# Patient Record
Sex: Male | Born: 1950 | Race: White | Hispanic: No | Smoking: Current every day smoker
Health system: Southern US, Community
[De-identification: ages and names within clinical notes are randomized; demographics above are authoritative.]

## PROBLEM LIST (undated history)

## (undated) DIAGNOSIS — J449 Chronic obstructive pulmonary disease, unspecified: Secondary | ICD-10-CM

---

## 2017-04-19 ENCOUNTER — Emergency Department (HOSPITAL_COMMUNITY): Payer: No Typology Code available for payment source

## 2017-04-19 ENCOUNTER — Encounter (HOSPITAL_COMMUNITY): Payer: Self-pay | Admitting: Emergency Medicine

## 2017-04-19 ENCOUNTER — Emergency Department (HOSPITAL_COMMUNITY)
Admission: EM | Admit: 2017-04-19 | Discharge: 2017-04-20 | Disposition: A | Discharge status: Home / Self Care | Payer: No Typology Code available for payment source | Attending: Emergency Medicine | Admitting: Emergency Medicine

## 2017-04-19 DIAGNOSIS — Y999 Unspecified external cause status: Secondary | ICD-10-CM | POA: Insufficient documentation

## 2017-04-19 DIAGNOSIS — F17200 Nicotine dependence, unspecified, uncomplicated: Secondary | ICD-10-CM | POA: Diagnosis not present

## 2017-04-19 DIAGNOSIS — S2242XA Multiple fractures of ribs, left side, initial encounter for closed fracture: Secondary | ICD-10-CM | POA: Insufficient documentation

## 2017-04-19 DIAGNOSIS — S42145A Nondisplaced fracture of glenoid cavity of scapula, left shoulder, initial encounter for closed fracture: Secondary | ICD-10-CM | POA: Insufficient documentation

## 2017-04-19 DIAGNOSIS — Y9389 Activity, other specified: Secondary | ICD-10-CM | POA: Insufficient documentation

## 2017-04-19 DIAGNOSIS — R042 Hemoptysis: Secondary | ICD-10-CM | POA: Insufficient documentation

## 2017-04-19 DIAGNOSIS — Y92410 Unspecified street and highway as the place of occurrence of the external cause: Secondary | ICD-10-CM | POA: Diagnosis not present

## 2017-04-19 DIAGNOSIS — J449 Chronic obstructive pulmonary disease, unspecified: Secondary | ICD-10-CM | POA: Diagnosis not present

## 2017-04-19 DIAGNOSIS — M542 Cervicalgia: Secondary | ICD-10-CM | POA: Insufficient documentation

## 2017-04-19 DIAGNOSIS — S022XXA Fracture of nasal bones, initial encounter for closed fracture: Secondary | ICD-10-CM | POA: Diagnosis not present

## 2017-04-19 DIAGNOSIS — R0602 Shortness of breath: Secondary | ICD-10-CM | POA: Diagnosis not present

## 2017-04-19 DIAGNOSIS — M791 Myalgia: Secondary | ICD-10-CM | POA: Diagnosis not present

## 2017-04-19 DIAGNOSIS — S299XXA Unspecified injury of thorax, initial encounter: Secondary | ICD-10-CM | POA: Diagnosis present

## 2017-04-19 HISTORY — DX: Chronic obstructive pulmonary disease, unspecified: J44.9
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
## 2017-04-19 NOTE — ED Notes (Signed)
Pt returned from xray/CT.

## 2017-04-19 NOTE — ED Notes (Signed)
Patient transported to CT/xray 

## 2017-04-20 ENCOUNTER — Emergency Department (HOSPITAL_COMMUNITY): Payer: No Typology Code available for payment source

## 2017-04-20 ENCOUNTER — Encounter (HOSPITAL_COMMUNITY): Payer: Self-pay

## 2017-04-20 DIAGNOSIS — S2242XA Multiple fractures of ribs, left side, initial encounter for closed fracture: Secondary | ICD-10-CM | POA: Diagnosis not present

## 2017-04-20 LAB — COMPREHENSIVE METABOLIC PANEL
ALT: 23 U/L (ref 17–63)
AST: 43 U/L — ABNORMAL HIGH (ref 15–41)
Albumin: 3.9 g/dL (ref 3.5–5.0)
Alkaline Phosphatase: 55 U/L (ref 38–126)
Anion gap: 8 (ref 5–15)
BUN: 10 mg/dL (ref 6–20)
CHLORIDE: 101 mmol/L (ref 101–111)
CO2: 25 mmol/L (ref 22–32)
CREATININE: 0.88 mg/dL (ref 0.61–1.24)
Calcium: 8.9 mg/dL (ref 8.9–10.3)
GFR calc Af Amer: >60 mL/min (ref >60)
GFR calc non Af Amer: >60 mL/min (ref >60)
Glucose, Bld: 114 mg/dL — ABNORMAL HIGH (ref 65–99)
POTASSIUM: 3.3 mmol/L — AB (ref 3.5–5.1)
Sodium: 134 mmol/L — ABNORMAL LOW (ref 135–145)
Total Bilirubin: 0.4 mg/dL (ref 0.3–1.2)
Total Protein: 6.4 g/dL — ABNORMAL LOW (ref 6.5–8.1)

## 2017-04-20 LAB — CBC
HEMATOCRIT: 42.4 % (ref 39.0–52.0)
HEMOGLOBIN: 14.6 g/dL (ref 13.0–17.0)
MCH: 33.2 pg (ref 26.0–34.0)
MCHC: 34.4 g/dL (ref 30.0–36.0)
MCV: 96.4 fL (ref 78.0–100.0)
Platelets: 231 10*3/uL (ref 150–400)
RBC: 4.4 MIL/uL (ref 4.22–5.81)
RDW: 14.8 % (ref 11.5–15.5)
WBC: 19.7 10*3/uL — ABNORMAL HIGH (ref 4.0–10.5)

## 2017-04-20 LAB — ETHANOL: ALCOHOL ETHYL (B): 45 mg/dL — AB (ref <5)

## 2017-04-20 MED ORDER — HYDROMORPHONE HCL 1 MG/ML IJ SOLN
0.5000 mg | Freq: Once | INTRAMUSCULAR | Status: AC
  Administered 2017-04-20: 0.5 mg via INTRAVENOUS
  Filled 2017-04-20: qty 1

## 2017-04-20 MED ORDER — OXYCODONE-ACETAMINOPHEN 5-325 MG PO TABS: 2.0000 | ORAL_TABLET | ORAL | 0 refills | Status: DC | PRN

## 2017-04-20 MED ORDER — ONDANSETRON HCL 4 MG/2ML IJ SOLN
4.0000 mg | Freq: Once | INTRAMUSCULAR | Status: AC
  Administered 2017-04-20: 4 mg via INTRAVENOUS
  Filled 2017-04-20: qty 2

## 2017-04-20 MED ORDER — IOPAMIDOL (ISOVUE-300) INJECTION 61%
100.0000 mL | Freq: Once | INTRAVENOUS | Status: AC | PRN
  Administered 2017-04-20: 100 mL via INTRAVENOUS

## 2017-04-20 MED ORDER — ONDANSETRON HCL 4 MG PO TABS: 4.0000 mg | ORAL_TABLET | Freq: Four times a day (QID) | ORAL | 0 refills | Status: AC

## 2017-04-20 MED ORDER — OXYCODONE-ACETAMINOPHEN 5-325 MG PO TABS: 1.0000 | ORAL_TABLET | ORAL | 0 refills | Status: DC | PRN

## 2017-04-20 MED FILL — Oxycodone w/ Acetaminophen Tab 5-325 MG: ORAL | Qty: 6 | Status: AC

## 2017-04-20 NOTE — ED Notes (Signed)
Patient transported to CT 
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
## 2017-04-20 NOTE — Discharge Instructions (Signed)
Return to the ER immediately if you have uncontrolled pain or if you are experiencing increased shortness of breath

## 2017-04-20 NOTE — ED Notes (Signed)
Pt's family came to nursing desk and asked if pt could have pain medication, Dr Blinda Leatherwood in room with pt and was notified of request when he came out of room,

## 2017-04-22 ENCOUNTER — Emergency Department (HOSPITAL_COMMUNITY)
Admission: EM | Admit: 2017-04-22 | Discharge: 2017-04-22 | Disposition: A | Discharge status: Home / Self Care | Payer: No Typology Code available for payment source | Attending: Emergency Medicine | Admitting: Emergency Medicine

## 2017-04-22 ENCOUNTER — Emergency Department (HOSPITAL_COMMUNITY): Payer: No Typology Code available for payment source

## 2017-04-22 ENCOUNTER — Encounter (HOSPITAL_COMMUNITY): Payer: Self-pay | Admitting: Emergency Medicine

## 2017-04-22 DIAGNOSIS — R05 Cough: Secondary | ICD-10-CM | POA: Insufficient documentation

## 2017-04-22 DIAGNOSIS — R0602 Shortness of breath: Secondary | ICD-10-CM | POA: Diagnosis not present

## 2017-04-22 DIAGNOSIS — F17200 Nicotine dependence, unspecified, uncomplicated: Secondary | ICD-10-CM | POA: Diagnosis not present

## 2017-04-22 DIAGNOSIS — S2242XD Multiple fractures of ribs, left side, subsequent encounter for fracture with routine healing: Secondary | ICD-10-CM

## 2017-04-22 DIAGNOSIS — J449 Chronic obstructive pulmonary disease, unspecified: Secondary | ICD-10-CM | POA: Diagnosis not present

## 2017-04-22 DIAGNOSIS — R0789 Other chest pain: Secondary | ICD-10-CM | POA: Diagnosis not present

## 2017-04-22 LAB — CBC WITH DIFFERENTIAL/PLATELET
BASOS ABS: 0.1 10*3/uL (ref 0.0–0.1)
Basophils Relative: 1 %
EOS PCT: 7 %
Eosinophils Absolute: 1.1 10*3/uL — ABNORMAL HIGH (ref 0.0–0.7)
HCT: 42.5 % (ref 39.0–52.0)
Hemoglobin: 14 g/dL (ref 13.0–17.0)
LYMPHS PCT: 9 %
Lymphs Abs: 1.4 10*3/uL (ref 0.7–4.0)
MCH: 32.6 pg (ref 26.0–34.0)
MCHC: 32.9 g/dL (ref 30.0–36.0)
MCV: 98.8 fL (ref 78.0–100.0)
MONO ABS: 1.4 10*3/uL — AB (ref 0.1–1.0)
MONOS PCT: 9 %
Neutro Abs: 12.1 10*3/uL — ABNORMAL HIGH (ref 1.7–7.7)
Neutrophils Relative %: 74 %
PLATELETS: 289 10*3/uL (ref 150–400)
RBC: 4.3 MIL/uL (ref 4.22–5.81)
RDW: 15.3 % (ref 11.5–15.5)
WBC: 16.1 10*3/uL — ABNORMAL HIGH (ref 4.0–10.5)

## 2017-04-22 LAB — COMPREHENSIVE METABOLIC PANEL
ALT: 20 U/L (ref 17–63)
ANION GAP: 8 (ref 5–15)
AST: 36 U/L (ref 15–41)
Albumin: 3.2 g/dL — ABNORMAL LOW (ref 3.5–5.0)
Alkaline Phosphatase: 58 U/L (ref 38–126)
BILIRUBIN TOTAL: 0.8 mg/dL (ref 0.3–1.2)
BUN: 16 mg/dL (ref 6–20)
CHLORIDE: 99 mmol/L — AB (ref 101–111)
CO2: 26 mmol/L (ref 22–32)
Calcium: 8.5 mg/dL — ABNORMAL LOW (ref 8.9–10.3)
Creatinine, Ser: 0.76 mg/dL (ref 0.61–1.24)
Glucose, Bld: 114 mg/dL — ABNORMAL HIGH (ref 65–99)
POTASSIUM: 3.8 mmol/L (ref 3.5–5.1)
Sodium: 133 mmol/L — ABNORMAL LOW (ref 135–145)
TOTAL PROTEIN: 5.9 g/dL — AB (ref 6.5–8.1)

## 2017-04-22 LAB — I-STAT CG4 LACTIC ACID, ED: LACTIC ACID, VENOUS: 1.41 mmol/L (ref 0.5–1.9)

## 2017-04-22 MED ORDER — DOCUSATE SODIUM 100 MG PO CAPS: 100.0000 mg | ORAL_CAPSULE | Freq: Two times a day (BID) | ORAL | 0 refills | Status: DC

## 2017-04-22 MED ORDER — OXYCODONE-ACETAMINOPHEN 5-325 MG PO TABS: 2.0000 | ORAL_TABLET | ORAL | 0 refills | Status: DC | PRN

## 2017-04-22 MED ORDER — DOCUSATE SODIUM 100 MG PO CAPS
100.0000 mg | ORAL_CAPSULE | Freq: Once | ORAL | Status: AC
  Administered 2017-04-22: 100 mg via ORAL
  Filled 2017-04-22: qty 1

## 2017-04-22 MED ORDER — DOCUSATE SODIUM 100 MG PO CAPS: 100.0000 mg | ORAL_CAPSULE | Freq: Two times a day (BID) | ORAL | 0 refills | Status: AC
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
## 2017-04-22 NOTE — Discharge Instructions (Signed)
Percocet for pain. Colace am and pm to prevent constipation. Home health nurse will contact you tomorrow to start home visits. Oxygen at all times. Recheck with your [facility] in 7-10 days regarding discontinuing your oxygen.
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
## 2017-04-28 ENCOUNTER — Encounter: Payer: Self-pay | Admitting: Orthopaedic Surgery

## 2017-04-28 ENCOUNTER — Ambulatory Visit (INDEPENDENT_AMBULATORY_CARE_PROVIDER_SITE_OTHER): Payer: Medicare Other | Admitting: Orthopaedic Surgery

## 2017-04-28 VITALS — BP 120/71 | HR 108 | Temp 97.0°F | Ht 69.0 in | Wt 132.8 lb

## 2017-04-28 DIAGNOSIS — F17210 Nicotine dependence, cigarettes, uncomplicated: Secondary | ICD-10-CM | POA: Diagnosis not present

## 2017-04-28 DIAGNOSIS — S2242XA Multiple fractures of ribs, left side, initial encounter for closed fracture: Secondary | ICD-10-CM

## 2017-04-28 DIAGNOSIS — S42192A Fracture of other part of scapula, left shoulder, initial encounter for closed fracture: Secondary | ICD-10-CM | POA: Diagnosis not present

## 2017-04-28 MED ORDER — OXYCODONE-ACETAMINOPHEN 7.5-325 MG PO TABS: ORAL_TABLET | ORAL | 0 refills | Status: DC

## 2017-04-28 NOTE — Patient Instructions (Addendum)
Steps to Quit Smoking Smoking tobacco can be bad for your health. It can also affect almost every organ in your body. Smoking puts you and people around you at risk for many serious long-lasting (chronic) diseases. Quitting smoking is hard, but it is one of the best things that you can do for your health. It is never too late to quit. What are the benefits of quitting smoking? When you quit smoking, you lower your risk for getting serious diseases and conditions. They can include:  Lung cancer or lung disease.  Heart disease.  Stroke.  Heart attack.  Not being able to have children (infertility).  Weak bones (osteoporosis) and broken bones (fractures).  If you have coughing, wheezing, and shortness of breath, those symptoms may get better when you quit. You may also get sick less often. If you are pregnant, quitting smoking can help to lower your chances of having a baby of low birth weight. What can I do to help me quit smoking? Talk with your doctor about what can help you quit smoking. Some things you can do (strategies) include:  Quitting smoking totally, instead of slowly cutting back how much you smoke over a period of time.  Going to in-person counseling. You are more likely to quit if you go to many counseling sessions.  Using resources and support systems, such as: ? Online chats with a counselor. ? Phone quitlines. ? Printed self-help materials. ? Support groups or group counseling. ? Text messaging programs. ? Mobile phone apps or applications.  Taking medicines. Some of these medicines may have nicotine in them. If you are pregnant or breastfeeding, do not take any medicines to quit smoking unless your doctor says it is okay. Talk with your doctor about counseling or other things that can help you.  Talk with your doctor about using more than one strategy at the same time, such as taking medicines while you are also going to in-person counseling. This can help make  quitting easier. What things can I do to make it easier to quit? Quitting smoking might feel very hard at first, but there is a lot that you can do to make it easier. Take these steps:  Talk to your family and friends. Ask them to support and encourage you.  Call phone quitlines, reach out to support groups, or work with a counselor.  Ask people who smoke to not smoke around you.  Avoid places that make you want (trigger) to smoke, such as: ? Bars. ? Parties. ? Smoke-break areas at work.  Spend time with people who do not smoke.  Lower the stress in your life. Stress can make you want to smoke. Try these things to help your stress: ? Getting regular exercise. ? Deep-breathing exercises. ? Yoga. ? Meditating. ? Doing a body scan. To do this, close your eyes, focus on one area of your body at a time from head to toe, and notice which parts of your body are tense. Try to relax the muscles in those areas.  Download or buy apps on your mobile phone or tablet that can help you stick to your quit plan. There are many free apps, such as QuitGuide from the CDC (Centers for Disease Control and Prevention). You can find more support from smokefree.gov and other websites.  This information is not intended to replace advice given to you by your health care provider. Make sure you discuss any questions you have with your health care provider. Document Released: 06/07/2009 Document   Revised: 04/08/2016 Document Reviewed: 12/26/2014 Elsevier Interactive Patient Education  2018 Elsevier Inc.  
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
## 2017-05-05 ENCOUNTER — Ambulatory Visit (INDEPENDENT_AMBULATORY_CARE_PROVIDER_SITE_OTHER): Payer: Medicare Other | Admitting: Orthopaedic Surgery

## 2017-05-05 ENCOUNTER — Ambulatory Visit (INDEPENDENT_AMBULATORY_CARE_PROVIDER_SITE_OTHER): Payer: Medicare Other

## 2017-05-05 DIAGNOSIS — S42192D Fracture of other part of scapula, left shoulder, subsequent encounter for fracture with routine healing: Secondary | ICD-10-CM

## 2017-05-05 MED ORDER — OXYCODONE-ACETAMINOPHEN 7.5-325 MG PO TABS: 1.0000 | ORAL_TABLET | Freq: Four times a day (QID) | ORAL | 0 refills | Status: DC | PRN

## 2017-05-05 NOTE — Progress Notes (Signed)
CC:  My shoulder is still sore  He is not wearing his sling today.  He has tenderness of the left scapula.    NV intact. ROM very limited.  X-rays were done and reported separately.  Encounter Diagnosis  Name Primary?  . Closed fracture of blade of scapula, left, with routine healing, subsequent encounter Yes   I have given new pain medicine.    He needs to use the sling.  I have given pointers to him.  Return in one month.  X-rays then.  I have reviewed the [place] Controlled Substance Reporting System web site prior to prescribing narcotic medicine for this patient.  Call if any problem.  Precautions discussed.  Consider PT on return.  Electronically Signed Darreld McleanWayne Kendyn Zaman, MD 9/11/20183:15 PM

## 2017-05-13 ENCOUNTER — Telehealth: Payer: Self-pay | Admitting: *Deleted

## 2017-05-13 NOTE — Telephone Encounter (Signed)
Mallory with [facility] called has a question regarding patient's ROM for lower extremity Please call Mallory at  [phone]

## 2017-05-14 NOTE — Telephone Encounter (Signed)
I called and left a message for Mason Bright.  I do not see an order in the computer stating that Dr. Hilda Lias ordered [facility].

## 2017-05-21 ENCOUNTER — Telehealth: Payer: Self-pay | Admitting: *Deleted

## 2017-05-21 NOTE — Telephone Encounter (Signed)
Patient is requesting oxycodone 7.5-325mg  to be refilled.   Patient of Dr Hilda Lias

## 2017-05-22 ENCOUNTER — Telehealth: Payer: Self-pay | Admitting: Orthopaedic Surgery

## 2017-05-22 ENCOUNTER — Other Ambulatory Visit: Payer: Self-pay | Admitting: Orthopedic Surgery

## 2017-05-22 MED ORDER — OXYCODONE-ACETAMINOPHEN 7.5-325 MG PO TABS: 1.0000 | ORAL_TABLET | Freq: Four times a day (QID) | ORAL | 0 refills | Status: DC | PRN

## 2017-05-22 NOTE — Telephone Encounter (Signed)
Patient wants to know if he can lie on his back while having some teeth extracted?  He has a fx scapula

## 2017-05-22 NOTE — Telephone Encounter (Signed)
I would say as Mason Bright as it doesn't bother him.Routing to Dr. Romeo Apple to advise anything further

## 2017-05-27 NOTE — Telephone Encounter (Signed)
Yes he can lay down on it.

## 2017-05-28 ENCOUNTER — Encounter (HOSPITAL_COMMUNITY): Payer: Medicare Other

## 2017-06-03 ENCOUNTER — Ambulatory Visit (INDEPENDENT_AMBULATORY_CARE_PROVIDER_SITE_OTHER): Payer: Medicare Other | Admitting: Orthopaedic Surgery

## 2017-06-03 ENCOUNTER — Ambulatory Visit (INDEPENDENT_AMBULATORY_CARE_PROVIDER_SITE_OTHER): Payer: Medicare Other

## 2017-06-03 DIAGNOSIS — S42192D Fracture of other part of scapula, left shoulder, subsequent encounter for fracture with routine healing: Secondary | ICD-10-CM

## 2017-06-03 MED ORDER — OXYCODONE-ACETAMINOPHEN 7.5-325 MG PO TABS: 1.0000 | ORAL_TABLET | Freq: Four times a day (QID) | ORAL | 0 refills | Status: DC | PRN

## 2017-06-03 NOTE — Progress Notes (Signed)
CC:  My shoulder is less painful  He has less pain with the left shoulder.    I will begin PT.  NV intact.  X-rays were done, reported separately of the scapula on the left.  Encounter Diagnosis  Name Primary?  . Closed fracture of blade of scapula, left, with routine healing, subsequent encounter Yes   Begin PT.  Return in two weeks.  X-rays on return.  Call if any problem.  I have reviewed the [place] Controlled Substance Reporting System web site prior to prescribing narcotic medicine for this patient.   Precautions discussed.   Electronically Signed Darreld Mclean, MD 10/10/20183:58 PM

## 2017-06-04 ENCOUNTER — Encounter (HOSPITAL_COMMUNITY): Admission: RE | Admit: 2017-06-04 | Payer: Medicare Other | Source: Ambulatory Visit

## 2017-06-09 ENCOUNTER — Ambulatory Visit (HOSPITAL_COMMUNITY): Payer: No Typology Code available for payment source | Attending: Orthopaedic Surgery

## 2017-06-09 ENCOUNTER — Encounter (HOSPITAL_COMMUNITY): Payer: Self-pay

## 2017-06-09 DIAGNOSIS — M25612 Stiffness of left shoulder, not elsewhere classified: Secondary | ICD-10-CM | POA: Diagnosis present

## 2017-06-09 DIAGNOSIS — M25512 Pain in left shoulder: Secondary | ICD-10-CM

## 2017-06-09 DIAGNOSIS — R29898 Other symptoms and signs involving the musculoskeletal system: Secondary | ICD-10-CM

## 2017-06-09 NOTE — Patient Instructions (Signed)
Complete 3-5 times. Each one 1 minute.    SHOULDER: Flexion On Table   Place hands on table, elbows straight. Move hips away from body. Press hands down into table. Hold ___ seconds. ___ reps per set, ___ sets per day, ___ days per week  Abduction (Passive)   With arm out to side, resting on table, lower head toward arm, keeping trunk away from table. Hold ____ seconds. Repeat ____ times. Do ____ sessions per day.  Copyright  VHI. All rights reserved.     Internal Rotation (Assistive)   Seated with elbow bent at right angle and held against side, slide arm on table surface in an inward arc. Repeat ____ times. Do ____ sessions per day. Activity: Use this motion to brush crumbs off the table.  Copyright  VHI. All rights reserved.

## 2017-06-10 DIAGNOSIS — R29898 Other symptoms and signs involving the musculoskeletal system: Secondary | ICD-10-CM | POA: Diagnosis not present
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
## 2017-06-12 ENCOUNTER — Telehealth (HOSPITAL_COMMUNITY): Payer: Self-pay | Admitting: Occupational Therapy

## 2017-06-12 ENCOUNTER — Ambulatory Visit (HOSPITAL_COMMUNITY): Payer: No Typology Code available for payment source | Admitting: Occupational Therapy

## 2017-06-12 NOTE — Telephone Encounter (Signed)
Called pt re: no-show, pt apologized for missing appt. Pt reminded on next appt on 06/15/17 at 10:30.  Ezra SitesLeslie [place], OTR/L  [phone] 06/12/2017

## 2017-06-15 ENCOUNTER — Encounter (HOSPITAL_COMMUNITY): Payer: Self-pay

## 2017-06-15 ENCOUNTER — Ambulatory Visit (HOSPITAL_COMMUNITY): Payer: No Typology Code available for payment source

## 2017-06-15 DIAGNOSIS — M25512 Pain in left shoulder: Secondary | ICD-10-CM

## 2017-06-15 DIAGNOSIS — R29898 Other symptoms and signs involving the musculoskeletal system: Secondary | ICD-10-CM

## 2017-06-15 DIAGNOSIS — M25612 Stiffness of left shoulder, not elsewhere classified: Secondary | ICD-10-CM

## 2017-06-15 NOTE — Therapy (Signed)
[facility] [address] [place], [place], [address] Phone: [phone]   Fax:  [phone]  Occupational Therapy Treatment  Patient Details  Name: Mason Bright MRN: [id number] Date of Birth: 08-Dec-1950 Referring Provider: Darreld Mclean, MD  Encounter Date: 06/15/2017      OT End of Session - 06/15/17 1143    Visit Number 2   Number of Visits 8   Date for OT Re-Evaluation 07/09/17   Authorization Type 1) Medicare 2) medicaid   Authorization Time Period before 10th visit   Authorization - Visit Number 2   Authorization - Number of Visits 10   OT Start Time 1040  Pt was checked in late for appt   OT Stop Time 1115   OT Time Calculation (min) 35 min   Activity Tolerance Patient tolerated treatment well   Behavior During Therapy [facility] for tasks assessed/performed      Past Medical History:  Diagnosis Date  . COPD (chronic obstructive pulmonary disease) (HCC)     No past surgical history on file.  There were no vitals filed for this visit.      Subjective Assessment - 06/15/17 1050    Subjective  S: I've been working on it.   Currently in Pain? Yes   Pain Score 2    Pain Location Scapula   Pain Orientation Left   Pain Descriptors / Indicators Constant;Discomfort   Pain Type Acute pain            [facility] OT Assessment - 06/15/17 1104      Assessment   Diagnosis Left scapula fracture     Precautions   Precautions None                  OT Treatments/Exercises (OP) - 06/15/17 1104      Exercises   Exercises Shoulder     Shoulder Exercises: Supine   Protraction AAROM;10 reps   Horizontal ABduction AAROM;10 reps   External Rotation AAROM;10 reps   Internal Rotation AAROM;10 reps   Flexion AAROM;10 reps   ABduction AAROM;10 reps     Shoulder Exercises: Seated   Row AROM;10 reps     Manual Therapy   Manual Therapy Myofascial release   Manual therapy comments Manual therapy completed prior to  exercises.   Myofascial Release Myofascial release and manual stretching completed to left scapular region (all boarders), trapezius, and upper arm to decrease fascial restrictions and increase joint mobility in a pain free zone.                   OT Short Term Goals - 06/15/17 1104      OT SHORT TERM GOAL #1   Title Patient will be educated and indepedent with HEP to increase functional use of LUE during daily tasks.    Time 4   Period Weeks   Status On-going     OT SHORT TERM GOAL #2   Title Patient will return to highest level of independence using his left UE for all reaching activities as well using his left arm as his non-dominant extremity for 80% of daily tasks.    Time 4   Period Weeks   Status On-going     OT SHORT TERM GOAL #3   Title Patient will report of decreased pain level of 3/10 when completing daily tasks with his LUE.    Time 4   Period Weeks   Status On-going     OT SHORT  TERM GOAL #4   Title Patient will decrease fascial restrictions of his left UE/scapular region to min amount or less in order to increase functional mobility needed for reaching tasks.    Time 4   Period Weeks   Status On-going     OT SHORT TERM GOAL #5   Title Patient will increase scapular and shoulder strength and stability to 4/5 in order to return to completing normal lifting activities including stabilizing a cup of coffee.   Time 4   Period Weeks   Status On-going     OT SHORT TERM GOAL #6   Title Patient will increase LUE and scapular ROM to [facility] in order to complete overhead activities with greater ease.   Time 4   Period Weeks   Status On-going                  Plan - 06/15/17 1147    Clinical Impression Statement A: Initiated myofascial release, manual stretching, followed by AA/ROM supine. Patient has made a dramatic improvement with ROM. Scapular positioning has greatly improved since initial evaluation. Patient was upgraded with AA/ROM supine this  session. Some pain noted more so with Abduction.    Plan P: Progress to AA/ROM standing and add to HEP. Measure before MD appointment to send updated measurements.    Consulted and Agree with Plan of Care Patient      Patient will benefit from skilled therapeutic intervention in order to improve the following deficits and impairments:  Decreased strength, Decreased range of motion, Pain, Impaired UE functional use, Increased fascial restricitons  Visit Diagnosis: Other symptoms and signs involving the musculoskeletal system  Acute pain of left shoulder  Stiffness of left shoulder, not elsewhere classified    Problem List There are no active problems to display for this patient.  Limmie PatriciaLaura Raahim Shartzer, OTR/L,CBIS  [phone]  06/15/2017, 12:49 PM  [facility] [address] [place], [place], [address] Phone: [phone]   Fax:  [phone]  Name: Evette GeorgesRandy C Orantes MRN: [id number] Date of Birth: 1950-12-18

## 2017-06-17 ENCOUNTER — Ambulatory Visit (HOSPITAL_COMMUNITY): Payer: No Typology Code available for payment source

## 2017-06-17 ENCOUNTER — Encounter (HOSPITAL_COMMUNITY): Payer: Self-pay

## 2017-06-17 ENCOUNTER — Ambulatory Visit (INDEPENDENT_AMBULATORY_CARE_PROVIDER_SITE_OTHER): Payer: Medicare Other

## 2017-06-17 ENCOUNTER — Ambulatory Visit (INDEPENDENT_AMBULATORY_CARE_PROVIDER_SITE_OTHER): Payer: Self-pay | Admitting: Orthopaedic Surgery

## 2017-06-17 DIAGNOSIS — S42192D Fracture of other part of scapula, left shoulder, subsequent encounter for fracture with routine healing: Secondary | ICD-10-CM

## 2017-06-17 DIAGNOSIS — R29898 Other symptoms and signs involving the musculoskeletal system: Secondary | ICD-10-CM

## 2017-06-17 DIAGNOSIS — M25512 Pain in left shoulder: Secondary | ICD-10-CM

## 2017-06-17 DIAGNOSIS — M25612 Stiffness of left shoulder, not elsewhere classified: Secondary | ICD-10-CM

## 2017-06-17 MED ORDER — OXYCODONE-ACETAMINOPHEN 7.5-325 MG PO TABS: 1.0000 | ORAL_TABLET | Freq: Four times a day (QID) | ORAL | 0 refills | Status: DC | PRN

## 2017-06-17 NOTE — Therapy (Signed)
[facility] [address] [place], [place], [address] Phone: [phone]   Fax:  [phone]  Occupational Therapy Treatment  Patient Details  Name: DEWARD SEBEK MRN: [id number] Date of Birth: 08/21/1951 Referring Provider: Darreld Mclean, MD  Encounter Date: 06/17/2017      OT End of Session - 06/17/17 1416    Visit Number 3   Number of Visits 8   Date for OT Re-Evaluation 07/09/17   Authorization Type 1) Medicare 2) medicaid   Authorization Time Period before 10th visit   Authorization - Visit Number 3   Authorization - Number of Visits 10   OT Start Time 1308   OT Stop Time 1345   OT Time Calculation (min) 37 min   Activity Tolerance Patient tolerated treatment well   Behavior During Therapy [facility] for tasks assessed/performed      Past Medical History:  Diagnosis Date  . COPD (chronic obstructive pulmonary disease) (HCC)     No past surgical history on file.  There were no vitals filed for this visit.      Subjective Assessment - 06/17/17 1330    Subjective  S: Trying to sleep is  nightmare.   Currently in Pain? Yes   Pain Score 4    Pain Location Scapula   Pain Orientation Left   Pain Descriptors / Indicators Aching;Discomfort   Pain Type Acute pain   Pain Radiating Towards N/A   Pain Onset More than a month ago   Pain Frequency Constant   Aggravating Factors  sleeping on back. movement   Pain Relieving Factors pain meds   Effect of Pain on Daily Activities max effect   Multiple Pain Sites No            [facility] OT Assessment - 06/17/17 1310      Assessment   Diagnosis Left scapula fracture     Precautions   Precautions None     Sensation   Additional Comments Serratus anterior MMT: 4/5     AROM   Overall AROM Comments Assessed seated. IR/er adducted.   AROM Assessment Site Shoulder   Right/Left Shoulder Left   Left Shoulder Flexion 108 Degrees  previous: 70   Left Shoulder ABduction 90  Degrees  previous: 65   Left Shoulder Internal Rotation 90 Degrees  previous: same   Left Shoulder External Rotation 45 Degrees  previous: 25     PROM   Overall PROM Comments Assessed supine. IR/er adducted.    PROM Assessment Site Shoulder   Right/Left Shoulder Left   Left Shoulder Flexion 120 Degrees  previous: 95   Left Shoulder ABduction 140 Degrees  previous: 84   Left Shoulder Internal Rotation 90 Degrees  previous: same   Left Shoulder External Rotation 85 Degrees  previous: 68     Strength   Overall Strength Comments Assessed seated. IR/er adducted.   Strength Assessment Site Shoulder   Right/Left Shoulder Left   Left Shoulder Flexion 3+/5  previous: 3-/5   Left Shoulder ABduction 3-/5  previous: 3-/5   Left Shoulder Internal Rotation 3+/5  previous: 3/5   Left Shoulder External Rotation 3/5  previous: 3-/5                  OT Treatments/Exercises (OP) - 06/17/17 1323      Exercises   Exercises Shoulder     Shoulder Exercises: Supine   Protraction PROM;10 reps;AAROM;12 reps   Horizontal ABduction PROM;10 reps;AAROM;12 reps   External  Rotation PROM;10 reps;AAROM;12 reps   Internal Rotation PROM;10 reps;AAROM;12 reps   Flexion PROM;10 reps;AAROM;12 reps   ABduction PROM;10 reps;AAROM;12 reps     Manual Therapy   Manual Therapy Myofascial release   Manual therapy comments Manual therapy completed prior to exercises.   Myofascial Release Myofascial release and manual stretching completed to left scapular region (all boarders), trapezius, and upper arm to decrease fascial restrictions and increase joint mobility in a pain free zone.                 OT Education - 06/17/17 1416    Education provided Yes   Education Details AA/ROM shoulder exercises.    Person(s) Educated Patient   Methods Explanation;Demonstration;Verbal cues;Handout   Comprehension Verbalized understanding          OT Short Term Goals - 06/15/17 1104      OT  SHORT TERM GOAL #1   Title Patient will be educated and indepedent with HEP to increase functional use of LUE during daily tasks.    Time 4   Period Weeks   Status On-going     OT SHORT TERM GOAL #2   Title Patient will return to highest level of independence using his left UE for all reaching activities as well using his left arm as his non-dominant extremity for 80% of daily tasks.    Time 4   Period Weeks   Status On-going     OT SHORT TERM GOAL #3   Title Patient will report of decreased pain level of 3/10 when completing daily tasks with his LUE.    Time 4   Period Weeks   Status On-going     OT SHORT TERM GOAL #4   Title Patient will decrease fascial restrictions of his left UE/scapular region to min amount or less in order to increase functional mobility needed for reaching tasks.    Time 4   Period Weeks   Status On-going     OT SHORT TERM GOAL #5   Title Patient will increase scapular and shoulder strength and stability to 4/5 in order to return to completing normal lifting activities including stabilizing a cup of coffee.   Time 4   Period Weeks   Status On-going     OT SHORT TERM GOAL #6   Title Patient will increase LUE and scapular ROM to [facility] in order to complete overhead activities with greater ease.   Time 4   Period Weeks   Status On-going                  Plan - 06/17/17 1417    Clinical Impression Statement A: Took measurements for follow up appointment with MD today. Patient has made significant improvement with ROM since initial evaluation. Was able to upgrade HEP to AA/ROM. VC for form and technique.   Plan P: Follow up on MD appointment. Add wall wash.      Patient will benefit from skilled therapeutic intervention in order to improve the following deficits and impairments:  Decreased strength, Decreased range of motion, Pain, Impaired UE functional use, Increased fascial restricitons  Visit Diagnosis: Other symptoms and signs involving  the musculoskeletal system  Acute pain of left shoulder  Stiffness of left shoulder, not elsewhere classified    Problem List There are no active problems to display for this patient.  Limmie Patricia, OTR/L,CBIS  [phone]  06/17/2017, 2:25 PM  [facility] [address] [place], [place],  [address] Phone: [phone]   Fax:  [phone]  Name: Evette GeorgesRandy C Haji MRN: [id number] Date of Birth: Jul 08, 1951
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
## 2017-06-17 NOTE — Patient Instructions (Signed)

## 2017-06-17 NOTE — Patient Instructions (Signed)

## 2017-06-22 ENCOUNTER — Telehealth (HOSPITAL_COMMUNITY): Payer: Self-pay | Admitting: General Practice

## 2017-06-22 ENCOUNTER — Ambulatory Visit (HOSPITAL_COMMUNITY): Payer: No Typology Code available for payment source | Admitting: Specialist

## 2017-06-22 NOTE — Telephone Encounter (Signed)
06/22/17  Pt came in and needed to reschedule today's appt and also Wed.

## 2017-06-24 ENCOUNTER — Ambulatory Visit (HOSPITAL_COMMUNITY): Payer: No Typology Code available for payment source | Admitting: Occupational Therapy

## 2017-06-25 ENCOUNTER — Encounter (HOSPITAL_COMMUNITY): Payer: Medicare Other | Admitting: Specialist

## 2017-06-25 ENCOUNTER — Telehealth (HOSPITAL_COMMUNITY): Payer: Self-pay | Admitting: General Practice

## 2017-06-25 NOTE — Telephone Encounter (Signed)
11/1/1he called back to say his daughter couldn't bring him today so he will keep his appt on Friday8

## 2017-06-29 ENCOUNTER — Ambulatory Visit (HOSPITAL_COMMUNITY): Payer: Medicare Other | Attending: Orthopaedic Surgery

## 2017-06-29 ENCOUNTER — Encounter (HOSPITAL_COMMUNITY): Payer: Self-pay

## 2017-06-29 DIAGNOSIS — M25512 Pain in left shoulder: Secondary | ICD-10-CM

## 2017-06-29 DIAGNOSIS — M25612 Stiffness of left shoulder, not elsewhere classified: Secondary | ICD-10-CM | POA: Diagnosis present

## 2017-06-29 DIAGNOSIS — R29898 Other symptoms and signs involving the musculoskeletal system: Secondary | ICD-10-CM

## 2017-06-29 NOTE — Therapy (Signed)
[facility] [address] [place], [place], [address] Phone: [phone]   Fax:  [phone]  Occupational Therapy Treatment  Patient Details  Name: Mason Bright MRN: [id number] Date of Birth: [birth year] Referring Provider: Darreld Mclean, MD   Encounter Date: 06/29/2017  OT End of Session - 06/29/17 1245    Visit Number  4    Number of Visits  8    Date for OT Re-Evaluation  07/09/17    Authorization Type  1) Medicare 2) medicaid    Authorization Time Period  before 10th visit    Authorization - Visit Number  4    Authorization - Number of Visits  10    OT Start Time  1035    OT Stop Time  1115    OT Time Calculation (min)  40 min    Activity Tolerance  Patient tolerated treatment well    Behavior During Therapy  [facility] for tasks assessed/performed       Past Medical History:  Diagnosis Date  . COPD (chronic obstructive pulmonary disease) (HCC)     History reviewed. No pertinent surgical history.  There were no vitals filed for this visit.  Subjective Assessment - 06/29/17 1033    Subjective   S: I am definitely getting better.    Currently in Pain?  Yes    Pain Score  4     Pain Location  Scapula    Pain Orientation  Left    Pain Descriptors / Indicators  Aching;Discomfort    Pain Type  Acute pain    Pain Radiating Towards  N/A    Pain Onset  More than a month ago    Pain Frequency  Constant    Aggravating Factors   movement and use, sleeping on back    Pain Relieving Factors  pain meds    Effect of Pain on Daily Activities  max effect         [facility] OT Assessment - 06/29/17 1047      Assessment   Diagnosis  Left scapula fracture      Precautions   Precautions  None               OT Treatments/Exercises (OP) - 06/29/17 1047      Exercises   Exercises  Shoulder      Shoulder Exercises: Supine   Protraction  PROM;5 reps;AAROM;15 reps    Horizontal ABduction  PROM;5 reps;AAROM;15 reps    External Rotation  PROM;5 reps;AAROM;15 reps    Internal Rotation  PROM;5 reps;AAROM;15 reps    Flexion  PROM;5 reps;AAROM;15 reps    ABduction  PROM;5 reps;AAROM;15 reps      Shoulder Exercises: Standing   Horizontal ABduction  AAROM;12 reps    External Rotation  AAROM;12 reps    Internal Rotation  AAROM;12 reps    Flexion  AAROM;12 reps    ABduction  AAROM;12 reps      Shoulder Exercises: ROM/Strengthening   Wall Wash  1'      Manual Therapy   Manual Therapy  Myofascial release    Manual therapy comments  Manual therapy completed prior to exercises.               OT Short Term Goals - 06/15/17 1104      OT SHORT TERM GOAL #1   Title  Patient will be educated and indepedent with HEP to increase functional use of LUE during daily tasks.  Time  4    Period  Weeks    Status  On-going      OT SHORT TERM GOAL #2   Title  Patient will return to highest level of independence using his left UE for all reaching activities as well using his left arm as his non-dominant extremity for 80% of daily tasks.     Time  4    Period  Weeks    Status  On-going      OT SHORT TERM GOAL #3   Title  Patient will report of decreased pain level of 3/10 when completing daily tasks with his LUE.     Time  4    Period  Weeks    Status  On-going      OT SHORT TERM GOAL #4   Title  Patient will decrease fascial restrictions of his left UE/scapular region to min amount or less in order to increase functional mobility needed for reaching tasks.     Time  4    Period  Weeks    Status  On-going      OT SHORT TERM GOAL #5   Title  Patient will increase scapular and shoulder strength and stability to 4/5 in order to return to completing normal lifting activities including stabilizing a cup of coffee.    Time  4    Period  Weeks    Status  On-going      OT SHORT TERM GOAL #6   Title  Patient will increase LUE and scapular ROM to [facility] in order to complete overhead activities with greater  ease.    Time  4    Period  Weeks    Status  On-going               Plan - 06/29/17 1246    Clinical Impression Statement  A: Added wall wash and standing AA/ROM exercises. patient did well with signs of fatigue and mild pain during standing exercises. Pt reports that MD appointment went well and everything is healed nicely. He is to continue OT.    Plan  P: Attempt A/ROM supine. Add overhead lacing.    Consulted and Agree with Plan of Care  Patient       Patient will benefit from skilled therapeutic intervention in order to improve the following deficits and impairments:  Decreased strength, Decreased range of motion, Pain, Impaired UE functional use, Increased fascial restricitons  Visit Diagnosis: Other symptoms and signs involving the musculoskeletal system  Acute pain of left shoulder  Stiffness of left shoulder, not elsewhere classified    Problem List There are no active problems to display for this patient.  Limmie PatriciaLaura Essenmacher, OTR/L,CBIS  [phone]  06/29/2017, 12:48 PM  [facility] [address] [place], [place], [address] Phone: [phone]   Fax:  [phone]  Name: Mason Bright MRN: [id number] Date of Birth: Jan 16, 1951

## 2017-07-03 ENCOUNTER — Encounter (HOSPITAL_COMMUNITY): Payer: Self-pay

## 2017-07-03 ENCOUNTER — Ambulatory Visit (HOSPITAL_COMMUNITY): Payer: Medicare Other

## 2017-07-03 DIAGNOSIS — M25612 Stiffness of left shoulder, not elsewhere classified: Secondary | ICD-10-CM

## 2017-07-03 DIAGNOSIS — R29898 Other symptoms and signs involving the musculoskeletal system: Secondary | ICD-10-CM | POA: Diagnosis not present

## 2017-07-03 DIAGNOSIS — M25512 Pain in left shoulder: Secondary | ICD-10-CM

## 2017-07-03 NOTE — Therapy (Signed)
[facility] [address] [place], [place], [address] Phone: [phone]   Fax:  [phone]  Occupational Therapy Treatment  Patient Details  [name] MRN: [id number] Date of Birth: Apr 16, 1951 Referring Provider: Darreld McleanWayne Keeling, MD   Encounter Date: 07/03/2017  OT End of Session - 07/03/17 1154    Visit Number  5    Number of Visits  8    Date for OT Re-Evaluation  07/09/17    Authorization Type  1) Medicare 2) medicaid    Authorization Time Period  before 10th visit    Authorization - Visit Number  5    Authorization - Number of Visits  10    OT Start Time  [id number]    OT Stop Time  1030    OT Time Calculation (min)  43 min    Activity Tolerance  Patient tolerated treatment well    Behavior During Therapy  [facility] for tasks assessed/performed       Past Medical History:  Diagnosis Date  . COPD (chronic obstructive pulmonary disease) (HCC)     History reviewed. No pertinent surgical history.  There were no vitals filed for this visit.  Subjective Assessment - 07/03/17 1005    Subjective   S: My back still feels like it needs to pop.    Currently in Pain?  Yes    Pain Score  4     Pain Location  Scapula    Pain Orientation  Left    Pain Descriptors / Indicators  Aching;Discomfort    Pain Type  Acute pain         [facility] OT Assessment - 07/03/17 1006      Assessment   Diagnosis  Left scapula fracture      Precautions   Precautions  None               OT Treatments/Exercises (OP) - 07/03/17 1006      Exercises   Exercises  Shoulder      Shoulder Exercises: Supine   Protraction  PROM;5 reps;AROM;10 reps    Horizontal ABduction  PROM;5 reps;AROM;10 reps    External Rotation  PROM;5 reps;AROM;10 reps P/ROM: abducted    Internal Rotation  PROM;5 reps;AROM;10 reps P/ROM: abducted    Flexion  PROM;5 reps;AROM;10 reps    ABduction  PROM;5 reps;AROM;10 reps      Shoulder Exercises: Standing   Protraction  AAROM;12 reps    External Rotation  AAROM;12 reps    Internal Rotation  AAROM;12 reps    Flexion  AAROM;12 reps      Shoulder Exercises: ROM/Strengthening   Wall Wash  1'    Proximal Shoulder Strengthening, Supine  10X no rest breaks      Manual Therapy   Manual Therapy  Myofascial release    Manual therapy comments  Manual therapy completed prior to exercises.               OT Short Term Goals - 06/15/17 1104      OT SHORT TERM GOAL #1   Title  Patient will be educated and indepedent with HEP to increase functional use of LUE during daily tasks.     Time  4    Period  Weeks    Status  On-going      OT SHORT TERM GOAL #2   Title  Patient will return to highest level of independence using his left UE for all reaching activities as well using  his left arm as his non-dominant extremity for 80% of daily tasks.     Time  4    Period  Weeks    Status  On-going      OT SHORT TERM GOAL #3   Title  Patient will report of decreased pain level of 3/10 when completing daily tasks with his LUE.     Time  4    Period  Weeks    Status  On-going      OT SHORT TERM GOAL #4   Title  Patient will decrease fascial restrictions of his left UE/scapular region to min amount or less in order to increase functional mobility needed for reaching tasks.     Time  4    Period  Weeks    Status  On-going      OT SHORT TERM GOAL #5   Title  Patient will increase scapular and shoulder strength and stability to 4/5 in order to return to completing normal lifting activities including stabilizing a cup of coffee.    Time  4    Period  Weeks    Status  On-going      OT SHORT TERM GOAL #6   Title  Patient will increase LUE and scapular ROM to [facility] in order to complete overhead activities with greater ease.    Time  4    Period  Weeks    Status  On-going               Plan - 07/03/17 1155    Clinical Impression Statement  A: Progressed to A/ROM supine and VC for from and  technique. patient continues to complain that his back feels like it needs to pop. He presents today with trigger points in upper trapezius and along lateral border of scapula. Passive pectoralis stretch completed seated.     Plan  P: Continue with AA/ROM standing. Add overhead lacing. Add pvc pipe slide.       Patient will benefit from skilled therapeutic intervention in order to improve the following deficits and impairments:  Decreased strength, Decreased range of motion, Pain, Impaired UE functional use, Increased fascial restricitons  Visit Diagnosis: Other symptoms and signs involving the musculoskeletal system  Acute pain of left shoulder  Stiffness of left shoulder, not elsewhere classified    Problem List There are no active problems to display for this patient.  Limmie PatriciaLaura Oluwafemi Villella, OTR/L,CBIS  [phone]  07/03/2017, 11:59 AM   [facility] [address] [place], [place], [address] Phone: [phone]   Fax:  [phone]  [name] MRN: [id number] Date of Birth: [birth year]

## 2017-07-06 ENCOUNTER — Encounter (HOSPITAL_COMMUNITY): Payer: Self-pay

## 2017-07-06 ENCOUNTER — Ambulatory Visit (HOSPITAL_COMMUNITY): Payer: Medicare Other

## 2017-07-06 DIAGNOSIS — M25612 Stiffness of left shoulder, not elsewhere classified: Secondary | ICD-10-CM

## 2017-07-06 DIAGNOSIS — R29898 Other symptoms and signs involving the musculoskeletal system: Secondary | ICD-10-CM

## 2017-07-06 DIAGNOSIS — M25512 Pain in left shoulder: Secondary | ICD-10-CM

## 2017-07-06 NOTE — Therapy (Signed)
[place] [facility] [address] [place], [place], [address] Phone: [phone]   Fax:  [phone]  Occupational Therapy Treatment  Patient Details  Name: Mason Bright MRN: [id number] Date of Birth: [birth year] Referring Provider: Darreld McleanWayne Keeling, MD   Encounter Date: 07/06/2017  OT End of Session - 07/06/17 1116    Visit Number  6    Number of Visits  8    Date for OT Re-Evaluation  07/09/17    Authorization Type  1) Medicare 2) medicaid    Authorization Time Period  before 10th visit    Authorization - Visit Number  6    Authorization - Number of Visits  10    OT Start Time  1038    OT Stop Time  1115    OT Time Calculation (min)  37 min    Activity Tolerance  Patient tolerated treatment well    Behavior During Therapy  [facility] for tasks assessed/performed       Past Medical History:  Diagnosis Date  . COPD (chronic obstructive pulmonary disease) (HCC)     History reviewed. No pertinent surgical history.  There were no vitals filed for this visit.  Subjective Assessment - 07/06/17 1054    Subjective   S: I'm still sore in those two spots.    Currently in Pain?  Yes    Pain Score  4     Pain Location  Scapula    Pain Orientation  Left    Pain Descriptors / Indicators  Aching;Discomfort    Pain Type  Acute pain         [facility] OT Assessment - 07/06/17 1054      Assessment   Diagnosis  Left scapula fracture      Precautions   Precautions  None               OT Treatments/Exercises (OP) - 07/06/17 1054      Exercises   Exercises  Shoulder      Shoulder Exercises: Supine   Protraction  PROM;5 reps;AROM;12 reps    Horizontal ABduction  PROM;5 reps;AROM;12 reps    External Rotation  PROM;5 reps;AROM;10 reps    Internal Rotation  PROM;5 reps;AROM;12 reps    Flexion  PROM;5 reps;AROM;12 reps    ABduction  PROM;5 reps;AROM;12 reps      Shoulder Exercises: Standing   Protraction  AROM;10 reps    Horizontal  ABduction  AROM;10 reps    External Rotation  AROM;10 reps    Internal Rotation  AROM;10 reps    Flexion  AROM;10 reps    ABduction  AROM;10 reps    Extension  Theraband;10 reps    Theraband Level (Shoulder Extension)  Level 2 (Red)    Row  Theraband;10 reps    Theraband Level (Shoulder Row)  Level 2 (Red)      Shoulder Exercises: ROM/Strengthening   Over Head Lace  2'    Proximal Shoulder Strengthening, Supine  12X no rest breaks    Proximal Shoulder Strengthening, Seated  10X no rest breaks      Manual Therapy   Manual Therapy  Myofascial release    Manual therapy comments  Manual therapy completed prior to exercises.    Myofascial Release  Myofascial release and manual stretching completed to left scapular region (all boarders), trapezius, and upper arm to decrease fascial restrictions and increase joint mobility in a pain free zone.  OT Education - 07/06/17 1115    Education provided  Yes    Education Details  Told patient to start A/ROM shoulder exercises now and do not hold onto the broom anymore.    Methods  Explanation    Comprehension  Verbalized understanding       OT Short Term Goals - 06/15/17 1104      OT SHORT TERM GOAL #1   Title  Patient will be educated and indepedent with HEP to increase functional use of LUE during daily tasks.     Time  4    Period  Weeks    Status  On-going      OT SHORT TERM GOAL #2   Title  Patient will return to highest level of independence using his left UE for all reaching activities as well using his left arm as his non-dominant extremity for 80% of daily tasks.     Time  4    Period  Weeks    Status  On-going      OT SHORT TERM GOAL #3   Title  Patient will report of decreased pain level of 3/10 when completing daily tasks with his LUE.     Time  4    Period  Weeks    Status  On-going      OT SHORT TERM GOAL #4   Title  Patient will decrease fascial restrictions of his left UE/scapular region to min  amount or less in order to increase functional mobility needed for reaching tasks.     Time  4    Period  Weeks    Status  On-going      OT SHORT TERM GOAL #5   Title  Patient will increase scapular and shoulder strength and stability to 4/5 in order to return to completing normal lifting activities including stabilizing a cup of coffee.    Time  4    Period  Weeks    Status  On-going      OT SHORT TERM GOAL #6   Title  Patient will increase LUE and scapular ROM to [facility] in order to complete overhead activities with greater ease.    Time  4    Period  Weeks    Status  On-going               Plan - 07/06/17 1116    Clinical Impression Statement  A: Pt was able to progress to all A/ROM exercises this session. Continues to have trigger points all lateral boarder of scapula and in upper trapezius. VC for form and technique as needed.     Plan  P: Continue with A/ROM exercises. Attempt scapular theraband retraction. Reassessment/re-cert       Patient will benefit from skilled therapeutic intervention in order to improve the following deficits and impairments:  Decreased strength, Decreased range of motion, Pain, Impaired UE functional use, Increased fascial restricitons  Visit Diagnosis: Other symptoms and signs involving the musculoskeletal system  Acute pain of left shoulder  Stiffness of left shoulder, not elsewhere classified    Problem List There are no active problems to display for this patient.  Limmie PatriciaLaura Von Quintanar, OTR/L,CBIS  [phone]  07/06/2017, 11:18 AM  [place] [facility] [address] [place], [place], [address] Phone: [phone]   Fax:  [phone]  Name: Mason Bright MRN: [id number] Date of Birth: 11-Nov-1950

## 2017-07-09 ENCOUNTER — Telehealth: Payer: Self-pay | Admitting: Orthopaedic Surgery

## 2017-07-09 MED ORDER — HYDROCODONE-ACETAMINOPHEN 5-325 MG PO TABS: ORAL_TABLET | ORAL | 0 refills | Status: DC

## 2017-07-09 NOTE — Telephone Encounter (Signed)
Oxycodone/Acetaminophen 7.5-325 mgs.   Qty  50  Sig: Take 1 tablet by mouth every 6 (six) hours as needed for moderate pain or severe pain (Must last 14 days.Do not take and drive a car or operate machinery).

## 2017-07-10 ENCOUNTER — Ambulatory Visit (HOSPITAL_COMMUNITY): Payer: Medicare Other

## 2017-07-10 ENCOUNTER — Telehealth (HOSPITAL_COMMUNITY): Payer: Self-pay

## 2017-07-10 NOTE — Telephone Encounter (Signed)
Called patient regarding no show. He stated he was given a different pain medication as he had run out of his old one. He was unable to sleep last night and he feels as if he is more tense with new muscle knots. Pt states he wonders if a muscle relaxer would help. Therapist recommended that he try using heat on his shoulder/blade prior to seeing his MD on Wednesday and then talk to him about muscle relaxers.  Limmie PatriciaLaura Gyasi Hazzard, OTR/L,CBIS  [phone]

## 2017-07-13 ENCOUNTER — Ambulatory Visit (HOSPITAL_COMMUNITY): Payer: Medicare Other

## 2017-07-13 DIAGNOSIS — M25512 Pain in left shoulder: Secondary | ICD-10-CM

## 2017-07-13 DIAGNOSIS — R29898 Other symptoms and signs involving the musculoskeletal system: Secondary | ICD-10-CM | POA: Diagnosis not present

## 2017-07-13 DIAGNOSIS — M25612 Stiffness of left shoulder, not elsewhere classified: Secondary | ICD-10-CM

## 2017-07-13 NOTE — Addendum Note (Signed)
Addended by: Limmie PatriciaESSENMACHER, LAURA D on: 07/13/2017 12:51 PM   Modules accepted: Orders

## 2017-07-13 NOTE — Therapy (Signed)
[place], [place], [address] Phone: [phone]   Fax:  [phone]  Occupational Therapy Treatment  Patient Details  Name: Mason Bright MRN: [id number] Date of Birth: Oct 25, 1950 Referring Provider: Sanjuana Kava, MD   Encounter Date: 07/13/2017  OT End of Session - 07/13/17 1230    Visit Number  7    Number of Visits  8    Date for OT Re-Evaluation  08/12/17    Authorization Type  1) Medicare 2) medicaid    Authorization Time Period  before 17th visit    Authorization - Visit Number  7    Authorization - Number of Visits  17    OT Start Time  1038    OT Stop Time  1117    OT Time Calculation (min)  39 min    Activity Tolerance  Patient tolerated treatment well    Behavior During Therapy  [facility] for tasks assessed/performed       Past Medical History:  Diagnosis Date  . COPD (chronic obstructive pulmonary disease) (HCC)     No past surgical history on file.  There were no vitals filed for this visit.  Subjective Assessment - 07/13/17 1154    Currently in Pain?  Yes    Pain Score  6     Pain Location  Scapula    Pain Orientation  Left    Pain Descriptors / Indicators  Aching    Pain Radiating Towards  N/A    Pain Onset  In the past 7 days    Pain Frequency  Constant    Aggravating Factors   Pain medication was changes as MD was out of town.    Pain Relieving Factors  pain meds    Effect of Pain on Daily Activities  max effect    Multiple Pain Sites  No         [facility] OT Assessment - 07/13/17 1053      Assessment   Diagnosis  Left scapula fracture    Onset Date  04/19/17      Precautions   Precautions  None      Prior Function   Level of Independence  Independent      Observation/Other Assessments   Observations  scapular position measurements taken with patient standing in relaxed posture. (Superior angle to spine midline) R: 8cm L: 6cm (inferior angle to spine midline) R: 10cm L:  13cm      Palpation   Palpation comment  Mod fascial restrictions in scapular and upper trapezius region.      AROM   Overall AROM Comments  Assessed seated. IR/er adducted.    AROM Assessment Site  Shoulder    Right/Left Shoulder  Left    Left Shoulder Flexion  145 Degrees previous: 145    Left Shoulder ABduction  132 Degrees previous: 90    Left Shoulder Internal Rotation  90 Degrees previous: same    Left Shoulder External Rotation  75 Degrees previous: 45      PROM   Overall PROM Comments  Assessed supine. IR/er adducted.     PROM Assessment Site  Shoulder    Right/Left Shoulder  Left    Left Shoulder Flexion  146 Degrees previous: 120    Left Shoulder ABduction  180 Degrees previous: 140    Left Shoulder Internal Rotation  90 Degrees previous: same    Left Shoulder External Rotation  90 Degrees previous: 85  Strength   Overall Strength Comments  Assessed seated. IR/er adducted.    Strength Assessment Site  Shoulder    Right/Left Shoulder  Left    Left Shoulder Flexion  4/5 previous: 3+/5    Left Shoulder ABduction  4/5 previuos: 3-/5    Left Shoulder Internal Rotation  5/5 previous: 3+/5    Left Shoulder External Rotation  4-/5 previous: 3/5               OT Treatments/Exercises (OP) - 07/13/17 1056      Exercises   Exercises  Shoulder      Shoulder Exercises: Supine   Protraction  PROM;5 reps;AROM;12 reps    Horizontal ABduction  PROM;5 reps;AROM;12 reps    External Rotation  PROM;5 reps;AROM;10 reps    Internal Rotation  PROM;5 reps;AROM;12 reps    Flexion  PROM;5 reps;AROM;12 reps    ABduction  PROM;5 reps;AROM;12 reps      Manual Therapy   Manual Therapy  Myofascial release    Manual therapy comments  Manual therapy completed prior to exercises.    Myofascial Release  Myofascial release and manual stretching completed to left scapular region (all boarders), trapezius, and upper arm to decrease fascial restrictions and increase joint mobility in a  pain free zone.              OT Education - 07/13/17 1229    Education provided  Yes    Education Details  Discussed progress in therapy and therapy goals. Pt is to continue completing A/ROM shoulder exercises.    Person(s) Educated  Patient    Methods  Explanation    Comprehension  Verbalized understanding       OT Short Term Goals - 07/13/17 1111      OT SHORT TERM GOAL #1   Title  Patient will be educated and indepedent with HEP to increase functional use of LUE during daily tasks.     Time  4    Period  Weeks    Status  Achieved      OT SHORT TERM GOAL #2   Title  Patient will return to highest level of independence using his left UE for all reaching activities as well using his left arm as his non-dominant extremity for 80% of daily tasks.     Baseline  50%    Time  4    Period  Weeks    Status  On-going      OT SHORT TERM GOAL #3   Title  Patient will report of decreased pain level of 3/10 when completing daily tasks with his LUE.     Time  4    Period  Weeks    Status  On-going      OT SHORT TERM GOAL #4   Title  Patient will decrease fascial restrictions of his left UE/scapular region to min amount or less in order to increase functional mobility needed for reaching tasks.     Time  4    Period  Weeks    Status  On-going      OT SHORT TERM GOAL #5   Title  Patient will increase scapular and shoulder strength and stability to 4/5 in order to return to completing normal lifting activities including stabilizing a cup of coffee.    Time  4    Period  Weeks    Status  On-going      OT SHORT TERM GOAL #6   Title  Patient will  increase LUE and scapular ROM to [facility] in order to complete overhead activities with greater ease.    Time  4    Period  Weeks    Status  Achieved               Plan - 07-24-2017 1230    Clinical Impression Statement  A: reassessment completed this date. patient has met 2/6 therapy goals. He reports that he is trying to use his  LUE for about 50% of his daily tasks. He recently ran out of his pain medication and when asked to get a refill he was given a different type as Dr. Luna Glasgow was out of town that day. He does have a follow up appointment with Dr. Luna Glasgow on Wednesday. OT recommended that patient continue with OT services for another 4 weeks to focus on strength, ROM, pain, and fascial restrictions.     OT Treatment/Interventions  Self-care/ADL training;Cryotherapy;Electrical Stimulation;Moist Heat;Passive range of motion;DME and/or AE instruction;Therapeutic activities;Therapeutic exercises;Iontophoresis;Ultrasound;Manual Therapy;Patient/family education    Plan  P: Follow up on MD appointment. Continue to complete A/ROM standing. Add overhead lacing.     Consulted and Agree with Plan of Care  Patient       Patient will benefit from skilled therapeutic intervention in order to improve the following deficits and impairments:  Decreased strength, Decreased range of motion, Pain, Impaired UE functional use, Increased fascial restricitons  Visit Diagnosis: Other symptoms and signs involving the musculoskeletal system  Acute pain of left shoulder  Stiffness of left shoulder, not elsewhere classified  G-Codes - 07/24/2017 1234    Functional Assessment Tool Used (Outpatient only)  FOTO score: 59/100 (41% impaired)    Functional Limitation  Carrying, moving and handling objects    Carrying, Moving and Handling Objects Current Status (J1914)  At least 40 percent but less than 60 percent impaired, limited or restricted    Carrying, Moving and Handling Objects Goal Status (N8295)  At least 20 percent but less than 40 percent impaired, limited or restricted       Problem List There are no active problems to display for this patient.  Ailene Ravel, OTR/L,CBIS  [phone]  2017-07-24, 12:36 PM  [place] [address] [place], [place], [address] Phone: [phone]    Fax:  [phone]  Name: Mason Bright MRN: [id number] Date of Birth: [birth year]

## 2017-07-15 ENCOUNTER — Encounter: Payer: Self-pay | Admitting: Orthopaedic Surgery

## 2017-07-15 ENCOUNTER — Ambulatory Visit (INDEPENDENT_AMBULATORY_CARE_PROVIDER_SITE_OTHER): Payer: Self-pay | Admitting: Orthopaedic Surgery

## 2017-07-15 ENCOUNTER — Ambulatory Visit (HOSPITAL_COMMUNITY): Payer: Medicare Other

## 2017-07-15 ENCOUNTER — Encounter (HOSPITAL_COMMUNITY): Payer: Self-pay

## 2017-07-15 ENCOUNTER — Ambulatory Visit (INDEPENDENT_AMBULATORY_CARE_PROVIDER_SITE_OTHER): Payer: Medicare Other

## 2017-07-15 DIAGNOSIS — S42192D Fracture of other part of scapula, left shoulder, subsequent encounter for fracture with routine healing: Secondary | ICD-10-CM

## 2017-07-15 DIAGNOSIS — M25612 Stiffness of left shoulder, not elsewhere classified: Secondary | ICD-10-CM

## 2017-07-15 DIAGNOSIS — R29898 Other symptoms and signs involving the musculoskeletal system: Secondary | ICD-10-CM

## 2017-07-15 DIAGNOSIS — M25512 Pain in left shoulder: Secondary | ICD-10-CM

## 2017-07-15 NOTE — Progress Notes (Signed)
CC:  My shoulder is still a little sore  He has been going to PT and has very good motion of the left shoulder.  He is feeling better.  He says he could not tolerate the change to the hydrocodene and stopped it.  NV intact.  I have reviewed the PT notes.  Motion is good.  X-rays were done, reported separately.  Encounter Diagnosis  Name Primary?  . Closed fracture of blade of scapula, left, with routine healing, subsequent encounter Yes   Continue exercises and PT.  Return in three weeks.  X-rays on return.  Call if any problem.  Precautions discussed.   Electronically Signed Darreld McleanWayne Selita Staiger, MD 11/21/20182:13 PM

## 2017-07-15 NOTE — Patient Instructions (Signed)
Steps to Quit Smoking Smoking tobacco can be bad for your health. It can also affect almost every organ in your body. Smoking puts you and people around you at risk for many serious long-lasting (chronic) diseases. Quitting smoking is hard, but it is one of the best things that you can do for your health. It is never too late to quit. What are the benefits of quitting smoking? When you quit smoking, you lower your risk for getting serious diseases and conditions. They can include:  Lung cancer or lung disease.  Heart disease.  Stroke.  Heart attack.  Not being able to have children (infertility).  Weak bones (osteoporosis) and broken bones (fractures).  If you have coughing, wheezing, and shortness of breath, those symptoms may get better when you quit. You may also get sick less often. If you are pregnant, quitting smoking can help to lower your chances of having a baby of low birth weight. What can I do to help me quit smoking? Talk with your doctor about what can help you quit smoking. Some things you can do (strategies) include:  Quitting smoking totally, instead of slowly cutting back how much you smoke over a period of time.  Going to in-person counseling. You are more likely to quit if you go to many counseling sessions.  Using resources and support systems, such as: ? Online chats with a counselor. ? Phone quitlines. ? Printed self-help materials. ? Support groups or group counseling. ? Text messaging programs. ? Mobile phone apps or applications.  Taking medicines. Some of these medicines may have nicotine in them. If you are pregnant or breastfeeding, do not take any medicines to quit smoking unless your doctor says it is okay. Talk with your doctor about counseling or other things that can help you.  Talk with your doctor about using more than one strategy at the same time, such as taking medicines while you are also going to in-person counseling. This can help make  quitting easier. What things can I do to make it easier to quit? Quitting smoking might feel very hard at first, but there is a lot that you can do to make it easier. Take these steps:  Talk to your family and friends. Ask them to support and encourage you.  Call phone quitlines, reach out to support groups, or work with a counselor.  Ask people who smoke to not smoke around you.  Avoid places that make you want (trigger) to smoke, such as: ? Bars. ? Parties. ? Smoke-break areas at work.  Spend time with people who do not smoke.  Lower the stress in your life. Stress can make you want to smoke. Try these things to help your stress: ? Getting regular exercise. ? Deep-breathing exercises. ? Yoga. ? Meditating. ? Doing a body scan. To do this, close your eyes, focus on one area of your body at a time from head to toe, and notice which parts of your body are tense. Try to relax the muscles in those areas.  Download or buy apps on your mobile phone or tablet that can help you stick to your quit plan. There are many free apps, such as QuitGuide from the CDC (Centers for Disease Control and Prevention). You can find more support from smokefree.gov and other websites.  This information is not intended to replace advice given to you by your health care provider. Make sure you discuss any questions you have with your health care provider. Document Released: 06/07/2009 Document   Revised: 04/08/2016 Document Reviewed: 12/26/2014 Elsevier Interactive Patient Education  2018 Elsevier Inc.  

## 2017-07-15 NOTE — Therapy (Signed)
[facility] [address] [place], [place], [address] Phone: [phone]   Fax:  [phone]  Occupational Therapy Treatment  Patient Details  Name: Mason GeorgesRandy C Schiller MRN: [id number] Date of Birth: Feb 12, 1951 Referring Provider: Darreld McleanWayne Keeling, MD   Encounter Date: 07/15/2017  OT End of Session - 07/15/17 1523    Visit Number  8    Number of Visits  16    Date for OT Re-Evaluation  08/12/17    Authorization Type  1) Medicare 2) medicaid    Authorization Time Period  before 17th visit    Authorization - Visit Number  8    Authorization - Number of Visits  17    OT Start Time  1435    OT Stop Time  1515    OT Time Calculation (min)  40 min    Activity Tolerance  Patient tolerated treatment well    Behavior During Therapy  [facility] for tasks assessed/performed       Past Medical History:  Diagnosis Date  . COPD (chronic obstructive pulmonary disease) (HCC)     History reviewed. No pertinent surgical history.  There were no vitals filed for this visit.  Subjective Assessment - 07/15/17 1511    Subjective   S: He did x-rays today and he said everything is healing good.    Currently in Pain?  Yes    Pain Score  6     Pain Location  Scapula    Pain Orientation  Left    Pain Descriptors / Indicators  Aching    Pain Type  Acute pain         [facility] OT Assessment - 07/15/17 1522      Assessment   Diagnosis  Left scapula fracture      Precautions   Precautions  None               OT Treatments/Exercises (OP) - 07/15/17 1522      Exercises   Exercises  Shoulder      Shoulder Exercises: Supine   Protraction  PROM;5 reps    Horizontal ABduction  PROM;5 reps    External Rotation  PROM;5 reps    Internal Rotation  PROM;5 reps    Flexion  PROM;5 reps    ABduction  PROM;5 reps      Shoulder Exercises: Standing   Protraction  AROM;10 reps    Horizontal ABduction  AROM;10 reps    External Rotation  AROM;10 reps    Internal Rotation  AROM;10 reps    Flexion  AROM;10 reps    ABduction  AROM;10 reps      Shoulder Exercises: ROM/Strengthening   "W" Arms  10X      Shoulder Exercises: Stretch   Cross Chest Stretch  2 reps;10 seconds      Manual Therapy   Manual Therapy  Myofascial release    Manual therapy comments  Manual therapy completed prior to exercises.    Myofascial Release  Myofascial release and manual stretching completed to left scapular region (all boarders), trapezius, and upper arm to decrease fascial restrictions and increase joint mobility in a pain free zone.                OT Short Term Goals - 07/13/17 1111      OT SHORT TERM GOAL #1   Title  Patient will be educated and indepedent with HEP to increase functional use of LUE during daily tasks.  Time  4    Period  Weeks    Status  Achieved      OT SHORT TERM GOAL #2   Title  Patient will return to highest level of independence using his left UE for all reaching activities as well using his left arm as his non-dominant extremity for 80% of daily tasks.     Baseline  50%    Time  4    Period  Weeks    Status  On-going      OT SHORT TERM GOAL #3   Title  Patient will report of decreased pain level of 3/10 when completing daily tasks with his LUE.     Time  4    Period  Weeks    Status  On-going      OT SHORT TERM GOAL #4   Title  Patient will decrease fascial restrictions of his left UE/scapular region to min amount or less in order to increase functional mobility needed for reaching tasks.     Time  4    Period  Weeks    Status  On-going      OT SHORT TERM GOAL #5   Title  Patient will increase scapular and shoulder strength and stability to 4/5 in order to return to completing normal lifting activities including stabilizing a cup of coffee.    Time  4    Period  Weeks    Status  On-going      OT SHORT TERM GOAL #6   Title  Patient will increase LUE and scapular ROM to [facility] in order to complete overhead  activities with greater ease.    Time  4    Period  Weeks    Status  Achieved               Plan - 07/15/17 1547    Clinical Impression Statement  A: Pt continues to experience pain in left scapula, left lateral rib region, and left deltoid. Session focused on manual techniques to decrease fascial restrictions. Pt appeared to respond wel to manual techniques although continues to experience pain to mentioned areas.     Plan  P: Complete overhead lacing. Continue with A/ROM standing and manual techniques to scapular region.        Patient will benefit from skilled therapeutic intervention in order to improve the following deficits and impairments:  Decreased strength, Decreased range of motion, Pain, Impaired UE functional use, Increased fascial restricitons  Visit Diagnosis: Other symptoms and signs involving the musculoskeletal system  Acute pain of left shoulder  Stiffness of left shoulder, not elsewhere classified    Problem List There are no active problems to display for this patient.  Limmie PatriciaLaura Zahari Xiang, OTR/L,CBIS  [phone]  07/15/2017, 3:52 PM  [place] [facility] [address] [place], [place], [address] Phone: [phone]   Fax:  [phone]  Name: Mason GeorgesRandy C Prokop MRN: [id number] Date of Birth: 11-02-1950

## 2017-07-20 ENCOUNTER — Ambulatory Visit (HOSPITAL_COMMUNITY): Payer: Medicare Other

## 2017-07-20 ENCOUNTER — Encounter (HOSPITAL_COMMUNITY): Payer: Self-pay

## 2017-07-20 DIAGNOSIS — M25612 Stiffness of left shoulder, not elsewhere classified: Secondary | ICD-10-CM

## 2017-07-20 DIAGNOSIS — R29898 Other symptoms and signs involving the musculoskeletal system: Secondary | ICD-10-CM | POA: Diagnosis not present

## 2017-07-20 DIAGNOSIS — M25512 Pain in left shoulder: Secondary | ICD-10-CM

## 2017-07-20 NOTE — Therapy (Signed)
[facility] [address] [place], [place], [address] Phone: [phone]   Fax:  [phone]  Occupational Therapy Treatment  Patient Details  [name] MRN: [id number] Date of Birth: 05-22-1951 Referring Provider: Darreld McleanWayne Keeling, MD   Encounter Date: 07/20/2017  OT End of Session - 07/20/17 1302    Visit Number  9    Number of Visits  16    Date for OT Re-Evaluation  08/12/17    Authorization Type  1) Medicare 2) medicaid    Authorization Time Period  before 17th visit    Authorization - Visit Number  9    Authorization - Number of Visits  17    OT Start Time  1038    OT Stop Time  1115    OT Time Calculation (min)  37 min    Activity Tolerance  Patient tolerated treatment well    Behavior During Therapy  [facility] for tasks assessed/performed       Past Medical History:  Diagnosis Date  . COPD (chronic obstructive pulmonary disease) (HCC)     History reviewed. No pertinent surgical history.  There were no vitals filed for this visit.  Subjective Assessment - 07/20/17 1258    Subjective   S: I don't want to say I have pain because I want to be better.    Currently in Pain?  Yes    Pain Score  5     Pain Location  Scapula    Pain Orientation  Left    Pain Descriptors / Indicators  Aching    Pain Type  Acute pain    Pain Radiating Towards  N/A    Pain Onset  In the past 7 days    Pain Frequency  Constant    Aggravating Factors   pain medication was changes as MD was out of town.    Pain Relieving Factors  pain meds    Effect of Pain on Daily Activities  mod effect    Multiple Pain Sites  No         [facility] OT Assessment - 07/20/17 1301      Assessment   Diagnosis  Left scapula fracture      Precautions   Precautions  None               OT Treatments/Exercises (OP) - 07/20/17 1301      Exercises   Exercises  Shoulder      Shoulder Exercises: Supine   Protraction  PROM;5 reps;AROM;12 reps    Horizontal ABduction  PROM;5 reps;AROM;12 reps    External Rotation  PROM;5 reps;AROM;12 reps    Internal Rotation  PROM;5 reps;AROM;12 reps    Flexion  PROM;5 reps;AROM;12 reps    ABduction  PROM;5 reps;AROM;12 reps      Shoulder Exercises: Standing   Protraction  AROM;12 reps    Horizontal ABduction  AROM;12 reps    External Rotation  AROM;12 reps    Internal Rotation  AROM;12 reps    Flexion  AROM;12 reps    ABduction  AROM;12 reps      Shoulder Exercises: ROM/Strengthening   UBE (Upper Arm Bike)  Level 1 2' reverse    Over Head Lace  2'    Proximal Shoulder Strengthening, Seated  12X no rest breaks               OT Short Term Goals - 07/20/17 1722      OT SHORT TERM GOAL #  1   Title  Patient will be educated and indepedent with HEP to increase functional use of LUE during daily tasks.     Time  4    Period  Weeks      OT SHORT TERM GOAL #2   Title  Patient will return to highest level of independence using his left UE for all reaching activities as well using his left arm as his non-dominant extremity for 80% of daily tasks.     Baseline  50%    Time  4    Period  Weeks    Status  On-going      OT SHORT TERM GOAL #3   Title  Patient will report of decreased pain level of 3/10 when completing daily tasks with his LUE.     Time  4    Period  Weeks    Status  On-going      OT SHORT TERM GOAL #4   Title  Patient will decrease fascial restrictions of his left UE/scapular region to min amount or less in order to increase functional mobility needed for reaching tasks.     Time  4    Period  Weeks    Status  On-going      OT SHORT TERM GOAL #5   Title  Patient will increase scapular and shoulder strength and stability to 4/5 in order to return to completing normal lifting activities including stabilizing a cup of coffee.    Time  4    Period  Weeks    Status  On-going      OT SHORT TERM GOAL #6   Title  Patient will increase LUE and scapular ROM to [facility] in  order to complete overhead activities with greater ease.    Time  4    Period  Weeks               Plan - 07/20/17 1721    Clinical Impression Statement  A: Added UBE bike and continued with overhead lacing to increase scapular stability. Pt continues to have slight limitation with P/ROM which is causing pain at end stretch. Overall, doing well with therapy requiring VC for form and technique.     Plan  P: Attempt scapular strengthening with red band if able to tolerate.       Patient will benefit from skilled therapeutic intervention in order to improve the following deficits and impairments:  Decreased strength, Decreased range of motion, Pain, Impaired UE functional use, Increased fascial restricitons  Visit Diagnosis: Other symptoms and signs involving the musculoskeletal system  Acute pain of left shoulder  Stiffness of left shoulder, not elsewhere classified    Problem List There are no active problems to display for this patient.  Limmie PatriciaLaura Felix Pratt, OTR/L,CBIS  [phone]  07/20/2017, 5:23 PM  [place] [facility] [address] [place], [place], [address] Phone: [phone]   Fax:  [phone]  [name] MRN: [id number] Date of Birth: 02/27/1951

## 2017-07-21 ENCOUNTER — Encounter (HOSPITAL_COMMUNITY): Payer: Self-pay

## 2017-07-21 ENCOUNTER — Ambulatory Visit (HOSPITAL_COMMUNITY): Payer: Medicare Other

## 2017-07-21 DIAGNOSIS — M25512 Pain in left shoulder: Secondary | ICD-10-CM

## 2017-07-21 DIAGNOSIS — M25612 Stiffness of left shoulder, not elsewhere classified: Secondary | ICD-10-CM

## 2017-07-21 DIAGNOSIS — R29898 Other symptoms and signs involving the musculoskeletal system: Secondary | ICD-10-CM | POA: Diagnosis not present

## 2017-07-21 NOTE — Therapy (Addendum)
[facility] [address] [place], [place], [address] Phone: [phone]   Fax:  [phone]  Occupational Therapy Treatment  Patient Details  [name] MRN: [id number] Date of Birth: [birth year] Referring Provider: Darreld McleanWayne Keeling, MD   Encounter Date: 07/21/2017  OT End of Session - 07/21/17 1151    Visit Number  10    Number of Visits  16    Date for OT Re-Evaluation  08/12/17    Authorization Type  1) Medicare 2) medicaid    Authorization Time Period  before 17th visit    Authorization - Visit Number  10   Authorization - Number of Visits  17    OT Start Time  1035    OT Stop Time  1115    OT Time Calculation (min)  40 min    Activity Tolerance  Patient tolerated treatment well    Behavior During Therapy  [facility] for tasks assessed/performed       Past Medical History:  Diagnosis Date  . COPD (chronic obstructive pulmonary disease) (HCC)     History reviewed. No pertinent surgical history.  There were no vitals filed for this visit.  Subjective Assessment - 07/21/17 1052    Subjective   S: I've been going, going since my feet hit the floor.     Currently in Pain?  Yes    Pain Score  5     Pain Location  Scapula    Pain Orientation  Left    Pain Descriptors / Indicators  Aching    Pain Type  Acute pain         [facility] OT Assessment - 07/21/17 1053      Assessment   Diagnosis  Left scapula fracture      Precautions   Precautions  None               OT Treatments/Exercises (OP) - 07/21/17 1054      Exercises   Exercises  Shoulder      Shoulder Exercises: Standing   Protraction  AROM;12 reps    Horizontal ABduction  AROM;12 reps    External Rotation  AROM;12 reps    Internal Rotation  AROM;12 reps    Flexion  AROM;12 reps    ABduction  AROM;12 reps    Extension  Theraband;12 reps    Theraband Level (Shoulder Extension)  Level 2 (Red)    Row  Theraband;12 reps    Theraband Level (Shoulder  Row)  Level 2 (Red)    Retraction  Theraband;12 reps    Theraband Level (Shoulder Retraction)  Level 2 (Red)      Shoulder Exercises: ROM/Strengthening   UBE (Upper Arm Bike)  Level 1 3' reverse    "W" Arms  12X    X to V Arms  12X    Proximal Shoulder Strengthening, Seated  12X no rest breaks    Other ROM/Strengthening Exercises  Facing wall Y arm lift off; 10X      Manual Therapy   Manual Therapy  Myofascial release    Manual therapy comments  Manual therapy completed prior to exercises.    Myofascial Release  Myofascial release and manual stretching completed to left scapular region (all boarders), trapezius, and upper arm to decrease fascial restrictions and increase joint mobility in a pain free zone.                OT Short Term Goals - 07/20/17 1722  OT SHORT TERM GOAL #1   Title  Patient will be educated and indepedent with HEP to increase functional use of LUE during daily tasks.     Time  4    Period  Weeks      OT SHORT TERM GOAL #2   Title  Patient will return to highest level of independence using his left UE for all reaching activities as well using his left arm as his non-dominant extremity for 80% of daily tasks.     Baseline  50%    Time  4    Period  Weeks    Status  On-going      OT SHORT TERM GOAL #3   Title  Patient will report of decreased pain level of 3/10 when completing daily tasks with his LUE.     Time  4    Period  Weeks    Status  On-going      OT SHORT TERM GOAL #4   Title  Patient will decrease fascial restrictions of his left UE/scapular region to min amount or less in order to increase functional mobility needed for reaching tasks.     Time  4    Period  Weeks    Status  On-going      OT SHORT TERM GOAL #5   Title  Patient will increase scapular and shoulder strength and stability to 4/5 in order to return to completing normal lifting activities including stabilizing a cup of coffee.    Time  4    Period  Weeks    Status   On-going      OT SHORT TERM GOAL #6   Title  Patient will increase LUE and scapular ROM to [facility] in order to complete overhead activities with greater ease.    Time  4    Period  Weeks               Plan - 07/21/17 1151    Clinical Impression Statement  A: Added Y arm lift off, and resumed scapular theraband to increase strength and stability. VC for form and technique. patient was given Biofreeze sample and applied to right scapular region.     Plan  P: Continue to slowly progress as patient is able to tolerate.        Patient will benefit from skilled therapeutic intervention in order to improve the following deficits and impairments:  Decreased strength, Decreased range of motion, Pain, Impaired UE functional use, Increased fascial restricitons  Visit Diagnosis: Other symptoms and signs involving the musculoskeletal system  Acute pain of left shoulder  Stiffness of left shoulder, not elsewhere classified    Problem List There are no active problems to display for this patient.  Limmie PatriciaLaura Camdyn Beske, OTR/L,CBIS  [phone]  07/21/2017, 12:24 PM  [facility] [address] [place], [place], [address] Phone: [phone]   Fax:  [phone]  [name] MRN: [id number] Date of Birth: 10-22-1950

## 2017-07-23 ENCOUNTER — Encounter (HOSPITAL_COMMUNITY): Payer: Medicare Other

## 2017-07-24 ENCOUNTER — Encounter (HOSPITAL_COMMUNITY): Payer: Medicare Other | Admitting: Occupational Therapy

## 2017-07-27 ENCOUNTER — Ambulatory Visit (HOSPITAL_COMMUNITY): Payer: Medicare Other | Attending: Orthopaedic Surgery | Admitting: Specialist

## 2017-07-27 ENCOUNTER — Encounter (HOSPITAL_COMMUNITY): Payer: Self-pay | Admitting: Specialist

## 2017-07-27 DIAGNOSIS — M25512 Pain in left shoulder: Secondary | ICD-10-CM | POA: Insufficient documentation

## 2017-07-27 DIAGNOSIS — M25612 Stiffness of left shoulder, not elsewhere classified: Secondary | ICD-10-CM | POA: Diagnosis present

## 2017-07-27 DIAGNOSIS — R29898 Other symptoms and signs involving the musculoskeletal system: Secondary | ICD-10-CM | POA: Diagnosis not present

## 2017-07-27 NOTE — Therapy (Addendum)
[facility] [address] [place], [place], [address] Phone: [phone]   Fax:  [phone]  Occupational Therapy Treatment  Patient Details  [name] MRN: [id number] Date of Birth: 08-13-1951 Referring Provider: Darreld McleanWayne Keeling, MD   Encounter Date: 07/27/2017  OT End of Session - 07/27/17 1142    Visit Number  11    Number of Visits  16    Date for OT Re-Evaluation  08/12/17    Authorization Type  1) Medicare 2) medicaid    Authorization Time Period  before 17th visit    Authorization - Visit Number  11   Authorization - Number of Visits  17    OT Start Time  1030    OT Stop Time  1130    OT Time Calculation (min)  60 min       Past Medical History:  Diagnosis Date  . COPD (chronic obstructive pulmonary disease) (HCC)     History reviewed. No pertinent surgical history.  There were no vitals filed for this visit.  Subjective Assessment - 07/27/17 1141    Subjective   S:  Im still having alot of soreness along my shoulder blade (pointing to medial)     Currently in Pain?  Yes    Pain Score  4     Pain Location  Scapula    Pain Orientation  Left    Pain Descriptors / Indicators  Sharp    Pain Type  Acute pain    Pain Onset  In the past 7 days    Pain Frequency  Constant         [facility] OT Assessment - 07/27/17 0001      Assessment   Diagnosis  Left scapula fracture      Precautions   Precautions  None               OT Treatments/Exercises (OP) - 07/27/17 0001      Shoulder Exercises: Supine   Protraction  PROM;5 reps;AROM;12 reps    Horizontal ABduction  PROM;5 reps;AROM;12 reps    External Rotation  PROM;5 reps;AROM;12 reps    Internal Rotation  PROM;5 reps;AROM;12 reps    Flexion  PROM;5 reps;AROM;12 reps    ABduction  PROM;5 reps;AROM;12 reps      Modalities   Modalities  Electrical Stimulation;Moist Heat      Moist Heat Therapy   Number Minutes Moist Heat  15 Minutes    Moist  Heat Location  Shoulder      Electrical Stimulation   Electrical Stimulation Location  left scapula    Electrical Stimulation Action  sweeping constant    Electrical Stimulation Parameters  9.6 intesnity    Electrical Stimulation Goals  Pain      Manual Therapy   Manual Therapy  Myofascial release    Manual therapy comments  Manual therapy completed prior to exercises.    Myofascial Release  Myofascial release and manual stretching completed to left scapular region (all boarders), trapezius, and upper arm to decrease fascial restrictions and increase joint mobility in a pain free zone.                OT Short Term Goals - 07/20/17 1722      OT SHORT TERM GOAL #1   Title  Patient will be educated and indepedent with HEP to increase functional use of LUE during daily tasks.     Time  4    Period  Weeks      OT SHORT TERM GOAL #2   Title  Patient will return to highest level of independence using his left UE for all reaching activities as well using his left arm as his non-dominant extremity for 80% of daily tasks.     Baseline  50%    Time  4    Period  Weeks    Status  On-going      OT SHORT TERM GOAL #3   Title  Patient will report of decreased pain level of 3/10 when completing daily tasks with his LUE.     Time  4    Period  Weeks    Status  On-going      OT SHORT TERM GOAL #4   Title  Patient will decrease fascial restrictions of his left UE/scapular region to min amount or less in order to increase functional mobility needed for reaching tasks.     Time  4    Period  Weeks    Status  On-going      OT SHORT TERM GOAL #5   Title  Patient will increase scapular and shoulder strength and stability to 4/5 in order to return to completing normal lifting activities including stabilizing a cup of coffee.    Time  4    Period  Weeks    Status  On-going      OT SHORT TERM GOAL #6   Title  Patient will increase LUE and scapular ROM to [facility] in order to complete overhead  activities with greater ease.    Time  4    Period  Weeks               Plan - 07/27/17 1149    Clinical Impression Statement  A;  Patient continuing to experience increased sharp pain in medial scapular region and along inferior border and angle.  added manual therapy to axillary area to target subscapularis muscle.  also added interferential with heat to scapular region to decrease pain and tightness.     Plan  P:  follow up on use of IFES for pain control, continue use if helping to alleviate pain.        Patient will benefit from skilled therapeutic intervention in order to improve the following deficits and impairments:  Pain, Impaired UE functional use, Decreased range of motion, Decreased strength, Increased muscle spasms, Increased fascial restrictions  Visit Diagnosis: Other symptoms and signs involving the musculoskeletal system  Acute pain of left shoulder  Stiffness of left shoulder, not elsewhere classified    Problem List There are no active problems to display for this patient.   Shirlean MylarBethany H. Murray, MHA, OTR/L [phone]  07/27/2017, 12:04 PM  [facility] [address] [place], [place], [address] Phone: [phone]   Fax:  [phone]  Name: Mason GeorgesRandy C Mason Bright MRN: [id number] Date of Birth: May 10, 1951

## 2017-07-29 ENCOUNTER — Encounter (HOSPITAL_COMMUNITY): Payer: Self-pay

## 2017-07-29 ENCOUNTER — Ambulatory Visit (HOSPITAL_COMMUNITY): Payer: Medicare Other

## 2017-07-29 DIAGNOSIS — M25512 Pain in left shoulder: Secondary | ICD-10-CM

## 2017-07-29 DIAGNOSIS — R29898 Other symptoms and signs involving the musculoskeletal system: Secondary | ICD-10-CM | POA: Diagnosis not present

## 2017-07-29 DIAGNOSIS — M25612 Stiffness of left shoulder, not elsewhere classified: Secondary | ICD-10-CM

## 2017-07-29 NOTE — Therapy (Signed)
[facility] [address] [place], [place], [address] Phone: [phone]   Fax:  [phone]  Occupational Therapy Treatment  Patient Details  Name: Mason Bright MRN: [id number] Date of Birth: [birth year] Referring Provider: Darreld McleanWayne Keeling, MD   Encounter Date: 07/29/2017  OT End of Session - 07/29/17 1101    Visit Number  12    Number of Visits  16    Date for OT Re-Evaluation  08/12/17    Authorization Type  1) Medicare 2) medicaid    Authorization Time Period  before 17th visit    Authorization - Visit Number  12    Authorization - Number of Visits  17    OT Start Time  1034    OT Stop Time  1115    OT Time Calculation (min)  41 min    Activity Tolerance  Patient tolerated treatment well    Behavior During Therapy  [facility] for tasks assessed/performed       Past Medical History:  Diagnosis Date  . COPD (chronic obstructive pulmonary disease) (HCC)     History reviewed. No pertinent surgical history.  There were no vitals filed for this visit.  Subjective Assessment - 07/29/17 1053    Pain Score  3     Pain Location  Scapula    Pain Orientation  Left    Pain Descriptors / Indicators  Sharp    Pain Type  Acute pain    Pain Radiating Towards  N/A    Pain Onset  In the past 7 days    Pain Frequency  Constant    Aggravating Factors   putting on coat or belt    Pain Relieving Factors  pain meds, heat, ES at last session    Effect of Pain on Daily Activities  mod effect    Multiple Pain Sites  No                   OT Treatments/Exercises (OP) - 07/29/17 1056      Exercises   Exercises  Shoulder      Shoulder Exercises: Supine   Protraction  PROM;5 reps    Horizontal ABduction  PROM;5 reps    External Rotation  PROM;5 reps abducted    Internal Rotation  PROM;5 reps abducted    Flexion  PROM;5 reps    ABduction  PROM;5 reps      Shoulder Exercises: Prone   Other Prone Exercises  Hughston exercises; 10X;  H1, H3, H4      Shoulder Exercises: Standing   Horizontal ABduction  Theraband 12X    Theraband Level (Shoulder Horizontal ABduction)  Level 2 (Red)    External Rotation  Theraband;10 reps    Theraband Level (Shoulder External Rotation)  Level 2 (Red)    Internal Rotation  Theraband;12 reps    Theraband Level (Shoulder Internal Rotation)  Level 2 (Red)    Extension  Theraband;12 reps    Theraband Level (Shoulder Extension)  Level 2 (Red)    Row  Theraband;12 reps    Theraband Level (Shoulder Row)  Level 2 (Red)      Shoulder Exercises: ROM/Strengthening   UBE (Upper Arm Bike)  Level 1 3' reverse      Manual Therapy   Manual Therapy  Myofascial release;Muscle Energy Technique    Manual therapy comments  Manual therapy completed prior to exercises.    Myofascial Release  Myofascial release and manual stretching completed to left scapular region (  all boarders), trapezius, and upper arm to decrease fascial restrictions and increase joint mobility in a pain free zone.     Muscle Energy Technique  Muscle energy technique completed to shoulder flexors to to relax tone and muscle spasm and improve range of motion.                 OT Short Term Goals - 07/20/17 1722      OT SHORT TERM GOAL #1   Title  Patient will be educated and indepedent with HEP to increase functional use of LUE during daily tasks.     Time  4    Period  Weeks      OT SHORT TERM GOAL #2   Title  Patient will return to highest level of independence using his left UE for all reaching activities as well using his left arm as his non-dominant extremity for 80% of daily tasks.     Baseline  50%    Time  4    Period  Weeks    Status  On-going      OT SHORT TERM GOAL #3   Title  Patient will report of decreased pain level of 3/10 when completing daily tasks with his LUE.     Time  4    Period  Weeks    Status  On-going      OT SHORT TERM GOAL #4   Title  Patient will decrease fascial restrictions of his left  UE/scapular region to min amount or less in order to increase functional mobility needed for reaching tasks.     Time  4    Period  Weeks    Status  On-going      OT SHORT TERM GOAL #5   Title  Patient will increase scapular and shoulder strength and stability to 4/5 in order to return to completing normal lifting activities including stabilizing a cup of coffee.    Time  4    Period  Weeks    Status  On-going      OT SHORT TERM GOAL #6   Title  Patient will increase LUE and scapular ROM to [facility] in order to complete overhead activities with greater ease.    Time  4    Period  Weeks               Plan - 07/29/17 1135    Clinical Impression Statement  A: Pt reports that the ES last session really helped. He arrives today with a lower pain score than previously. Completed [place] exercises and theraband exercises to focus on scapular and shoulder strengthening. Muscle energy technique used to increase ROM during shoulder flexion.     Plan  P: Measure for MD appointment. continue with scapular and shoulder strengthening with theraband.        Patient will benefit from skilled therapeutic intervention in order to improve the following deficits and impairments:  Pain, Impaired UE functional use, Decreased range of motion, Decreased strength, Increased muscle spasms, Increased fascial restrictions  Visit Diagnosis: Other symptoms and signs involving the musculoskeletal system  Acute pain of left shoulder  Stiffness of left shoulder, not elsewhere classified    Problem List There are no active problems to display for this patient.  Limmie PatriciaLaura Willye Javier, OTR/L,CBIS  [phone]  07/29/2017, 11:37 AM  [facility] [address] [place], [place], [address] Phone: [phone]   Fax:  [phone]  Name: Mason Bright MRN: [id number]  Date of Birth: Sep 06, 1950

## 2017-08-03 ENCOUNTER — Ambulatory Visit (HOSPITAL_COMMUNITY): Payer: Medicare Other | Admitting: Specialist

## 2017-08-04 ENCOUNTER — Encounter (HOSPITAL_COMMUNITY): Payer: Self-pay

## 2017-08-04 ENCOUNTER — Ambulatory Visit (HOSPITAL_COMMUNITY): Payer: Medicare Other

## 2017-08-04 DIAGNOSIS — M25512 Pain in left shoulder: Secondary | ICD-10-CM

## 2017-08-04 DIAGNOSIS — R29898 Other symptoms and signs involving the musculoskeletal system: Secondary | ICD-10-CM | POA: Diagnosis not present

## 2017-08-04 DIAGNOSIS — M25612 Stiffness of left shoulder, not elsewhere classified: Secondary | ICD-10-CM

## 2017-08-04 NOTE — Patient Instructions (Signed)
*   Complete either every day or every other day which ever feels better. 10-15 repetitions. 1 set. 1 time each day.  ELASTIC BAND SHOULDER EXTENSION  While holding an elastic band in front of you with your elbows straight, pull the band down and back towards your side.    ELASTIC BAND ROWS - 90 ABD  Holding an elastic band with both hands, draw back the band as you bend your elbows. Keep your about 90 degrees away from the side of your body.     ELASTIC BAND - HORIZONTAL ABDUCTION  Start by holding an elastic band or tubing with your arm out-stretched in front of you and across your body towards the opposite side.  Next, pull the elastic band or cord horizontally and outward as shown.   Your elbow should be straight or slightly bent the entire time.    ELASTIC BAND SHOULDER EXTERNAL ROTATION - ER  While holding an elastic band at your side with your elbow bent, start with your hand near your stomach and then pull the band away. Keep your elbow at your side the entire time.    ELASTIC BAND SHOULDER INTERNAL ROTATION - IR  While holding an elastic band at your side with your elbow bent, start with your hand away from your stomach, then pull the band towards your stomach. Keep your elbow near your side the entire time.    ELASTIC BAND SHOULDER FLEXION  While holding an elastic band at your side, draw up your arm up in front of you keeping your elbow straight.

## 2017-08-04 NOTE — Therapy (Signed)
[facility] [address] [place], [place], [address] Phone: [phone]   Fax:  [phone]  Occupational Therapy Treatment And reassessment Patient Details  Name: Mason Bright MRN: [id number] Date of Birth: June 16, 1951 Referring Provider (Historical): Sanjuana Kava, MD   Encounter Date: 08/04/2017  OT End of Session - 08/04/17 1228    Visit Number  13    Number of Visits  16    Authorization Type  1) Medicare 2) medicaid    Authorization Time Period  before 17th visit    Authorization - Visit Number  13    Authorization - Number of Visits  17    OT Start Time  4076    OT Stop Time  1211    OT Time Calculation (min)  44 min    Activity Tolerance  Patient tolerated treatment well    Behavior During Therapy  WFL for tasks assessed/performed       Past Medical History:  Diagnosis Date  . COPD (chronic obstructive pulmonary disease) ([place])     History reviewed. No pertinent surgical history.  There were no vitals filed for this visit.  Subjective Assessment - 08/04/17 1200    Subjective   S: I did a lot of shoveling with both arms which was a workout.    Currently in Pain?  Yes    Pain Score  3     Pain Location  Scapula    Pain Orientation  Left    Pain Descriptors / Indicators  Aching         [facility] OT Assessment - 08/04/17 1138      Assessment   Medical Diagnosis  Left scapular fracture    Referring Provider  Dr. Sanjuana Kava    Onset Date/Surgical Date  04/19/17      Precautions   Precautions  None      AROM   Overall AROM Comments  Assessed seated. IR/er adducted.    AROM Assessment Site  Shoulder    Right/Left Shoulder  Left    Left Shoulder Flexion  146 Degrees previous: 145    Left Shoulder ABduction  139 Degrees previous: 132    Left Shoulder Internal Rotation  90 Degrees previous: same    Left Shoulder External Rotation  78 Degrees previous: 75      PROM   Overall PROM Comments  Assessed supine.  All other shoulder ranges (flexion, IR/er). Shoulder adducted,    PROM Assessment Site  Shoulder    Right/Left Shoulder  Left    Left Shoulder Flexion  160 Degrees previous: 146      Strength   Overall Strength Comments  Assessed seated. IR/er adducted.    Strength Assessment Site  Shoulder    Right/Left Shoulder  Left    Left Shoulder Flexion  4+/5 previous: 4/5    Left Shoulder ABduction  4+/5 previous: 4/5    Left Shoulder Internal Rotation  5/5 previuos: same    Left Shoulder External Rotation  4+/5 previous: 4+/5               OT Treatments/Exercises (OP) - 08/04/17 1201      Exercises   Exercises  Shoulder      Shoulder Exercises: Supine   Protraction  PROM;5 reps    Horizontal ABduction  PROM;5 reps    External Rotation  PROM;5 reps abducted    Internal Rotation  PROM;5 reps abducted    Flexion  PROM;5 reps  ABduction  PROM;5 reps      Shoulder Exercises: Standing   Horizontal ABduction  Theraband;10 reps    Theraband Level (Shoulder Horizontal ABduction)  Level 2 (Red)    External Rotation  Theraband;10 reps    Theraband Level (Shoulder External Rotation)  Level 2 (Red)    Internal Rotation  Theraband;12 reps    Flexion  Theraband;10 reps    Theraband Level (Shoulder Flexion)  Level 2 (Red)    ABduction  --    Theraband Level (Shoulder ABduction)  --    Extension  Theraband;10 reps    Theraband Level (Shoulder Extension)  Level 2 (Red)    Retraction  Theraband;10 reps    Theraband Level (Shoulder Retraction)  Level 2 (Red)      Manual Therapy   Manual Therapy  Myofascial release    Manual therapy comments  Manual therapy completed prior to exercises.    Myofascial Release  Myofascial release and manual stretching completed to left scapular region (all boarders), trapezius, and upper arm to decrease fascial restrictions and increase joint mobility in a pain free zone.              OT Education - 08/04/17 1159    Education provided  Yes     Education Details  red band scapular and shoulder strengthening exercises.     Person(s) Educated  Patient    Methods  Explanation;Demonstration;Verbal cues;Tactile cues;Handout    Comprehension  Returned demonstration;Verbalized understanding       OT Short Term Goals - 08/04/17 1143      OT SHORT TERM GOAL #1   Title  Patient will be educated and indepedent with HEP to increase functional use of LUE during daily tasks.     Time  4    Period  Weeks      OT SHORT TERM GOAL #2   Title  Patient will return to highest level of independence using his left UE for all reaching activities as well using his left arm as his non-dominant extremity for 80% of daily tasks.     Time  4    Period  Weeks    Status  Achieved      OT SHORT TERM GOAL #3   Title  Patient will report of decreased pain level of 3/10 when completing daily tasks with his LUE.     Time  4    Period  Weeks    Status  Not Met      OT SHORT TERM GOAL #4   Title  Patient will decrease fascial restrictions of his left UE/scapular region to min amount or less in order to increase functional mobility needed for reaching tasks.     Time  4    Period  Weeks    Status  Achieved      OT SHORT TERM GOAL #5   Title  Patient will increase scapular and shoulder strength and stability to 4/5 in order to return to completing normal lifting activities including stabilizing a cup of coffee.    Time  4    Period  Weeks    Status  Achieved      OT SHORT TERM GOAL #6   Title  Patient will increase LUE and scapular ROM to [facility] in order to complete overhead activities with greater ease.    Time  4    Period  Weeks               Plan -  09/03/17 1228    Clinical Impression Statement  A: Reassessment completed this date as patient has a follow up appointment with Dr. Luna Glasgow tomorrow. He has made great progress with therapy, showing improvement with strength, ROM, and decreasing pain and fascial restrictions. Overall, he has met  all but one therapy goal which is related to pain. Patient reports that since he is using his left arm for more activities it will hurt. Discussed that usually it will take up to a year before it feels "normal." Recommended discharge this time with HEP as patient is capable of completing HEP independently.     Plan  P: D/C with HEP.       Patient will benefit from skilled therapeutic intervention in order to improve the following deficits and impairments:  Pain, Impaired UE functional use, Decreased range of motion, Decreased strength, Increased muscle spasms, Increased fascial restrictions  Visit Diagnosis: Other symptoms and signs involving the musculoskeletal system  Acute pain of left shoulder  Stiffness of left shoulder, not elsewhere classified  G-Codes - 09-03-2017 1235    Functional Assessment Tool Used (Outpatient only)  FOTO score: 75/100 (25% impaired)    Functional Limitation  Carrying, moving and handling objects    Carrying, Moving and Handling Objects Goal Status ([id number])  At least 20 percent but less than 40 percent impaired, limited or restricted    Carrying, Moving and Handling Objects Discharge Status [phone])  At least 20 percent but less than 40 percent impaired, limited or restricted       Problem List There are no active problems to display for this patient.  OCCUPATIONAL THERAPY DISCHARGE SUMMARY  Visits from Start of Care: 13  Current functional level related to goals / functional outcomes: See above   Remaining deficits: See above   Education / Equipment: See above Plan: Patient agrees to discharge.  Patient goals were met. Patient is being discharged due to meeting the stated rehab goals.  ?????         Ailene Ravel, OTR/L,CBIS  [phone]  09/03/17, 12:36 PM  [facility] [address] [place], [place], [address] Phone: [phone]   Fax:  [phone]  Name: RAAD CLAYSON MRN:  [id number] Date of Birth: [birth year]

## 2017-08-05 ENCOUNTER — Encounter: Payer: Self-pay | Admitting: Orthopaedic Surgery

## 2017-08-05 ENCOUNTER — Ambulatory Visit (INDEPENDENT_AMBULATORY_CARE_PROVIDER_SITE_OTHER): Payer: Self-pay | Admitting: Orthopaedic Surgery

## 2017-08-05 ENCOUNTER — Ambulatory Visit (INDEPENDENT_AMBULATORY_CARE_PROVIDER_SITE_OTHER): Payer: Medicare Other

## 2017-08-05 DIAGNOSIS — S42192D Fracture of other part of scapula, left shoulder, subsequent encounter for fracture with routine healing: Secondary | ICD-10-CM

## 2017-08-05 MED ORDER — HYDROCODONE-ACETAMINOPHEN 5-325 MG PO TABS: ORAL_TABLET | ORAL | 0 refills | Status: AC

## 2017-08-05 NOTE — Patient Instructions (Signed)
Steps to Quit Smoking Smoking tobacco can be bad for your health. It can also affect almost every organ in your body. Smoking puts you and people around you at risk for many serious Randle Shatzer-lasting (chronic) diseases. Quitting smoking is hard, but it is one of the best things that you can do for your health. It is never too late to quit. What are the benefits of quitting smoking? When you quit smoking, you lower your risk for getting serious diseases and conditions. They can include:  Lung cancer or lung disease.  Heart disease.  Stroke.  Heart attack.  Not being able to have children (infertility).  Weak bones (osteoporosis) and broken bones (fractures).  If you have coughing, wheezing, and shortness of breath, those symptoms may get better when you quit. You may also get sick less often. If you are pregnant, quitting smoking can help to lower your chances of having a baby of low birth weight. What can I do to help me quit smoking? Talk with your doctor about what can help you quit smoking. Some things you can do (strategies) include:  Quitting smoking totally, instead of slowly cutting back how much you smoke over a period of time.  Going to in-person counseling. You are more likely to quit if you go to many counseling sessions.  Using resources and support systems, such as: ? Online chats with a counselor. ? Phone quitlines. ? Printed self-help materials. ? Support groups or group counseling. ? Text messaging programs. ? Mobile phone apps or applications.  Taking medicines. Some of these medicines may have nicotine in them. If you are pregnant or breastfeeding, do not take any medicines to quit smoking unless your doctor says it is okay. Talk with your doctor about counseling or other things that can help you.  Talk with your doctor about using more than one strategy at the same time, such as taking medicines while you are also going to in-person counseling. This can help make  quitting easier. What things can I do to make it easier to quit? Quitting smoking might feel very hard at first, but there is a lot that you can do to make it easier. Take these steps:  Talk to your family and friends. Ask them to support and encourage you.  Call phone quitlines, reach out to support groups, or work with a counselor.  Ask people who smoke to not smoke around you.  Avoid places that make you want (trigger) to smoke, such as: ? Bars. ? Parties. ? Smoke-break areas at work.  Spend time with people who do not smoke.  Lower the stress in your life. Stress can make you want to smoke. Try these things to help your stress: ? Getting regular exercise. ? Deep-breathing exercises. ? Yoga. ? Meditating. ? Doing a body scan. To do this, close your eyes, focus on one area of your body at a time from head to toe, and notice which parts of your body are tense. Try to relax the muscles in those areas.  Download or buy apps on your mobile phone or tablet that can help you stick to your quit plan. There are many free apps, such as QuitGuide from the CDC (Centers for Disease Control and Prevention). You can find more support from smokefree.gov and other websites.  This information is not intended to replace advice given to you by your health care provider. Make sure you discuss any questions you have with your health care provider. Document Released: 06/07/2009 Document   Revised: 04/08/2016 Document Reviewed: 12/26/2014 Elsevier Interactive Patient Education  2018 Elsevier Inc.  

## 2017-08-05 NOTE — Progress Notes (Signed)
CC:  My shoulder is much better  He has done well from the left scapula fracture.  He has full ROM and no pain.  NV intact.  X-rays were done and reported separately.  Encounter Diagnosis  Name Primary?  . Closed fracture of blade of scapula, left, with routine healing, subsequent encounter Yes   Discharge.  I have reviewed the [place] Controlled Substance Reporting System web site prior to prescribing narcotic medicine for this patient.  Electronically Signed Darreld McleanWayne Shavonte Zhao, MD 12/12/201810:48 AM

## 2017-08-07 ENCOUNTER — Ambulatory Visit (HOSPITAL_COMMUNITY): Payer: Medicare Other

## 2018-01-12 ENCOUNTER — Other Ambulatory Visit (HOSPITAL_COMMUNITY): Payer: Self-pay | Admitting: Allergy

## 2018-01-12 DIAGNOSIS — R911 Solitary pulmonary nodule: Secondary | ICD-10-CM

## 2018-02-01 ENCOUNTER — Ambulatory Visit (HOSPITAL_COMMUNITY): Payer: Medicare Other

## 2018-05-05 ENCOUNTER — Ambulatory Visit (HOSPITAL_COMMUNITY)
Admission: RE | Admit: 2018-05-05 | Discharge: 2018-05-05 | Disposition: A | Discharge status: Home / Self Care | Payer: Medicare Other | Source: Ambulatory Visit | Attending: Allergy | Admitting: Allergy

## 2018-05-05 DIAGNOSIS — R918 Other nonspecific abnormal finding of lung field: Secondary | ICD-10-CM | POA: Insufficient documentation

## 2018-05-05 DIAGNOSIS — J439 Emphysema, unspecified: Secondary | ICD-10-CM | POA: Insufficient documentation

## 2018-05-05 DIAGNOSIS — R911 Solitary pulmonary nodule: Secondary | ICD-10-CM | POA: Diagnosis present

## 2018-05-05 DIAGNOSIS — I700 Atherosclerosis of aorta: Secondary | ICD-10-CM | POA: Insufficient documentation

## 2018-05-05 DIAGNOSIS — I2510 Atherosclerotic heart disease of native coronary artery without angina pectoris: Secondary | ICD-10-CM | POA: Diagnosis not present
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
